# Patient Record
Sex: Female | Born: 1970 | Race: White | Hispanic: No | Marital: Married | Smoking: Former smoker
Health system: Southern US, Community
[De-identification: ages and names within clinical notes are randomized; demographics above are authoritative.]

## PROBLEM LIST (undated history)

## (undated) DIAGNOSIS — I493 Ventricular premature depolarization: Secondary | ICD-10-CM

## (undated) DIAGNOSIS — J302 Other seasonal allergic rhinitis: Secondary | ICD-10-CM

## (undated) DIAGNOSIS — R5383 Other fatigue: Secondary | ICD-10-CM

## (undated) DIAGNOSIS — Z9109 Other allergy status, other than to drugs and biological substances: Secondary | ICD-10-CM

## (undated) DIAGNOSIS — I499 Cardiac arrhythmia, unspecified: Secondary | ICD-10-CM

## (undated) DIAGNOSIS — R008 Other abnormalities of heart beat: Secondary | ICD-10-CM

## (undated) DIAGNOSIS — F419 Anxiety disorder, unspecified: Secondary | ICD-10-CM

## (undated) DIAGNOSIS — R896 Abnormal cytological findings in specimens from other organs, systems and tissues: Secondary | ICD-10-CM

## (undated) DIAGNOSIS — R42 Dizziness and giddiness: Secondary | ICD-10-CM

## (undated) DIAGNOSIS — R0602 Shortness of breath: Secondary | ICD-10-CM

## (undated) DIAGNOSIS — R51 Headache: Secondary | ICD-10-CM

## (undated) HISTORY — DX: Headache: R51

## (undated) HISTORY — DX: Dizziness and giddiness: R42

## (undated) HISTORY — DX: Cardiac arrhythmia, unspecified: I49.9

## (undated) HISTORY — PX: WISDOM TOOTH EXTRACTION: SHX21

## (undated) HISTORY — DX: Other seasonal allergic rhinitis: J30.2

## (undated) HISTORY — DX: Shortness of breath: R06.02

## (undated) HISTORY — DX: Other fatigue: R53.83

## (undated) HISTORY — DX: Ventricular premature depolarization: I49.3

## (undated) HISTORY — DX: Anxiety disorder, unspecified: F41.9

## (undated) HISTORY — DX: Other allergy status, other than to drugs and biological substances: Z91.09

## (undated) HISTORY — PX: TUBAL LIGATION: SHX77

## (undated) HISTORY — DX: Abnormal cytological findings in specimens from other organs, systems and tissues: R89.6

## (undated) HISTORY — DX: Other abnormalities of heart beat: R00.8

---

## 1998-10-15 HISTORY — PX: TUBAL LIGATION: SHX77

## 1999-05-01 ENCOUNTER — Other Ambulatory Visit: Admission: RE | Admit: 1999-05-01 | Discharge: 1999-05-01 | Payer: Self-pay | Admitting: Gynecology

## 1999-10-05 ENCOUNTER — Inpatient Hospital Stay (HOSPITAL_COMMUNITY): Admission: AD | Admit: 1999-10-05 | Discharge: 1999-10-07 | Payer: Self-pay | Admitting: Gynecology

## 1999-10-09 ENCOUNTER — Inpatient Hospital Stay (HOSPITAL_COMMUNITY): Admission: AD | Admit: 1999-10-09 | Discharge: 1999-10-09 | Payer: Self-pay | Admitting: Gynecology

## 1999-11-14 ENCOUNTER — Other Ambulatory Visit: Admission: RE | Admit: 1999-11-14 | Discharge: 1999-11-14 | Payer: Self-pay | Admitting: Gynecology

## 2005-12-10 ENCOUNTER — Other Ambulatory Visit: Admission: RE | Admit: 2005-12-10 | Discharge: 2005-12-10 | Payer: Self-pay | Admitting: Gynecology

## 2007-06-16 DIAGNOSIS — IMO0001 Reserved for inherently not codable concepts without codable children: Secondary | ICD-10-CM

## 2007-06-16 HISTORY — DX: Reserved for inherently not codable concepts without codable children: IMO0001

## 2007-07-09 ENCOUNTER — Other Ambulatory Visit: Admission: RE | Admit: 2007-07-09 | Discharge: 2007-07-09 | Payer: Self-pay | Admitting: Gynecology

## 2008-09-22 ENCOUNTER — Other Ambulatory Visit: Admission: RE | Admit: 2008-09-22 | Discharge: 2008-09-22 | Payer: Self-pay | Admitting: Gynecology

## 2008-09-22 ENCOUNTER — Encounter: Payer: Self-pay | Admitting: Gynecology

## 2008-09-22 ENCOUNTER — Ambulatory Visit: Payer: Self-pay | Admitting: Gynecology

## 2009-06-15 DIAGNOSIS — R008 Other abnormalities of heart beat: Secondary | ICD-10-CM

## 2009-06-15 DIAGNOSIS — I498 Other specified cardiac arrhythmias: Secondary | ICD-10-CM | POA: Insufficient documentation

## 2009-06-15 HISTORY — DX: Other specified cardiac arrhythmias: I49.8

## 2009-06-15 HISTORY — DX: Other abnormalities of heart beat: R00.8

## 2009-07-13 ENCOUNTER — Emergency Department (HOSPITAL_COMMUNITY): Admission: EM | Admit: 2009-07-13 | Discharge: 2009-07-13 | Payer: Self-pay | Admitting: Emergency Medicine

## 2009-07-13 DIAGNOSIS — R0602 Shortness of breath: Secondary | ICD-10-CM | POA: Insufficient documentation

## 2009-07-13 DIAGNOSIS — R42 Dizziness and giddiness: Secondary | ICD-10-CM | POA: Insufficient documentation

## 2009-07-13 HISTORY — DX: Shortness of breath: R06.02

## 2009-07-13 HISTORY — DX: Dizziness and giddiness: R42

## 2009-07-14 ENCOUNTER — Emergency Department (HOSPITAL_COMMUNITY): Admission: EM | Admit: 2009-07-14 | Discharge: 2009-07-14 | Payer: Self-pay | Admitting: Emergency Medicine

## 2009-07-14 DIAGNOSIS — R519 Headache, unspecified: Secondary | ICD-10-CM | POA: Insufficient documentation

## 2009-07-14 HISTORY — DX: Headache, unspecified: R51.9

## 2009-07-18 ENCOUNTER — Ambulatory Visit: Payer: Self-pay | Admitting: Family Medicine

## 2009-07-22 ENCOUNTER — Ambulatory Visit: Payer: Self-pay | Admitting: Cardiovascular Disease

## 2009-07-22 ENCOUNTER — Encounter: Payer: Self-pay | Admitting: Cardiovascular Disease

## 2009-07-25 ENCOUNTER — Ambulatory Visit: Payer: Self-pay | Admitting: Family Medicine

## 2009-07-25 ENCOUNTER — Encounter: Payer: Self-pay | Admitting: Cardiovascular Disease

## 2009-07-27 ENCOUNTER — Encounter: Payer: Self-pay | Admitting: Cardiovascular Disease

## 2009-07-27 ENCOUNTER — Ambulatory Visit: Payer: Self-pay | Admitting: Cardiovascular Disease

## 2009-08-04 ENCOUNTER — Encounter: Payer: Self-pay | Admitting: Cardiovascular Disease

## 2009-08-04 ENCOUNTER — Ambulatory Visit: Payer: Self-pay | Admitting: Family Medicine

## 2009-08-22 ENCOUNTER — Telehealth: Payer: Self-pay | Admitting: Cardiovascular Disease

## 2009-08-22 ENCOUNTER — Ambulatory Visit: Payer: Self-pay | Admitting: Cardiovascular Disease

## 2009-08-24 ENCOUNTER — Ambulatory Visit: Payer: Self-pay | Admitting: Family Medicine

## 2009-09-05 ENCOUNTER — Ambulatory Visit: Payer: Self-pay | Admitting: Family Medicine

## 2009-09-23 ENCOUNTER — Other Ambulatory Visit: Admission: RE | Admit: 2009-09-23 | Discharge: 2009-09-23 | Payer: Self-pay | Admitting: Gynecology

## 2009-09-23 ENCOUNTER — Ambulatory Visit: Payer: Self-pay | Admitting: Gynecology

## 2009-11-01 ENCOUNTER — Ambulatory Visit: Payer: Self-pay | Admitting: Family Medicine

## 2009-11-22 ENCOUNTER — Ambulatory Visit: Payer: Self-pay | Admitting: Cardiovascular Disease

## 2009-11-25 ENCOUNTER — Encounter (INDEPENDENT_AMBULATORY_CARE_PROVIDER_SITE_OTHER): Payer: Self-pay | Admitting: *Deleted

## 2009-12-27 ENCOUNTER — Telehealth: Payer: Self-pay | Admitting: Cardiovascular Disease

## 2009-12-29 ENCOUNTER — Ambulatory Visit: Payer: Self-pay | Admitting: Cardiology

## 2009-12-29 ENCOUNTER — Ambulatory Visit (HOSPITAL_COMMUNITY): Admission: RE | Admit: 2009-12-29 | Discharge: 2009-12-29 | Payer: Self-pay | Admitting: Cardiovascular Disease

## 2010-05-19 ENCOUNTER — Telehealth (INDEPENDENT_AMBULATORY_CARE_PROVIDER_SITE_OTHER): Payer: Self-pay | Admitting: *Deleted

## 2010-11-14 NOTE — Letter (Signed)
Summary: Appointment - Cardiac MRI  [facility] Card[facility]     [place], [place]    Phone:   Fax:       November 25, 2009 MRN: [id number]   [facility] [address] [place], [place]  [address]   Dear Ms. [name],   We have scheduled the above patient for an appointment for a Cardiac MRI on 12-29-2009   at 9:00a.m.  Please refer to the below information for the location and instructions for this test:  Location:     [facility]       [address]       [place], [place]  [address] Instructions:    Wilmon Arms at [facility] Outpatient Registration 45 minutes prior to your appointment time.  This will ensure you are in the Radiology Department 30 minutes prior to your appointment.    There are no restrictions for this test you may eat and take medications as usual.  If you need to reschedule this appointment please call at the number listed above.    Sincerely,       Lorne Skeens  [facility] Scheduling Team

## 2010-11-14 NOTE — Progress Notes (Signed)
----   Converted from flag ---- ---- 12/27/2009 12:00 PM, Marilynne Halsted, CMA, AAMA wrote: Lynford Humphrey!  ---- 12/27/2009 11:41 AM, Dessie Coma wrote: Dr. Freida Busman did the MD review and the authorization # [id number] ------------------------------

## 2010-11-14 NOTE — Progress Notes (Signed)
  [name], she completed faxed back to me, I copied records [place] out today. Savannah Dennis  May 19, 2010 8:34 AM     Appended Document:  Copied Records again Mailed to pt

## 2010-11-27 ENCOUNTER — Ambulatory Visit: Payer: Self-pay | Admitting: Cardiovascular Disease

## 2010-12-27 ENCOUNTER — Encounter: Payer: Self-pay | Admitting: Cardiovascular Disease

## 2011-01-03 ENCOUNTER — Encounter: Payer: Self-pay | Admitting: Cardiology

## 2011-01-08 LAB — CREATININE, SERUM
Creatinine, Ser: 0.68 mg/dL (ref 0.4–1.2)
GFR calc Af Amer: >60 mL/min (ref >60)
GFR calc non Af Amer: >60 mL/min (ref >60)

## 2011-01-08 LAB — BUN: BUN: 11 mg/dL (ref 6–23)

## 2011-01-10 ENCOUNTER — Other Ambulatory Visit: Payer: Self-pay | Admitting: *Deleted

## 2011-01-10 DIAGNOSIS — I493 Ventricular premature depolarization: Secondary | ICD-10-CM

## 2011-01-11 ENCOUNTER — Other Ambulatory Visit: Payer: Self-pay | Admitting: *Deleted

## 2011-01-11 MED ORDER — ATENOLOL 25 MG PO TABS: 25.0000 mg | ORAL_TABLET | Freq: Every day | ORAL | Status: DC

## 2011-01-12 ENCOUNTER — Encounter (INDEPENDENT_AMBULATORY_CARE_PROVIDER_SITE_OTHER): Payer: 59 | Admitting: Gynecology

## 2011-01-12 ENCOUNTER — Telehealth: Payer: Self-pay

## 2011-01-12 ENCOUNTER — Ambulatory Visit (INDEPENDENT_AMBULATORY_CARE_PROVIDER_SITE_OTHER): Payer: 59 | Admitting: Cardiology

## 2011-01-12 ENCOUNTER — Encounter: Payer: Self-pay | Admitting: Cardiology

## 2011-01-12 VITALS — BP 111/77 | HR 79 | Resp 18 | Ht 66.0 in | Wt 166.4 lb

## 2011-01-12 DIAGNOSIS — I493 Ventricular premature depolarization: Secondary | ICD-10-CM

## 2011-01-12 DIAGNOSIS — N898 Other specified noninflammatory disorders of vagina: Secondary | ICD-10-CM

## 2011-01-12 DIAGNOSIS — B373 Candidiasis of vulva and vagina: Secondary | ICD-10-CM

## 2011-01-12 DIAGNOSIS — B3731 Acute candidiasis of vulva and vagina: Secondary | ICD-10-CM

## 2011-01-12 DIAGNOSIS — N951 Menopausal and female climacteric states: Secondary | ICD-10-CM

## 2011-01-12 DIAGNOSIS — Z01419 Encounter for gynecological examination (general) (routine) without abnormal findings: Secondary | ICD-10-CM

## 2011-01-12 DIAGNOSIS — I4949 Other premature depolarization: Secondary | ICD-10-CM

## 2011-01-12 HISTORY — DX: Ventricular premature depolarization: I49.3
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
## 2011-01-12 NOTE — Patient Instructions (Addendum)
Schedule an appointment for a 48 hour monitor.  Please ask Dr Audie Box to fax your recent TSH to Dr Shirlee Latch [phone].  Schedule an appointment to see Dr Shirlee Latch in 1 year(March 2013)

## 2011-01-12 NOTE — Assessment & Plan Note (Signed)
Patient has been evaluated in the past by Dr. Freida Busman at our [facility] office and was found to have a high burden of PVCs (20% total beats) with a LBBB morphology. She was started on atenolol with significant improvement in palpitations.  She had a structurally normal heart as evidenced by normal stress echo and cardiac MRI with no evidence for arrhythmogenic RV cardiomyopathy.  No major abnormalities on ECG.  My main concern at this point is that she continues to have a high burden of PVCs as this can potentially lead to a cardiomyopathy.  She will continue atenolol.  I will get a repeat 48 hour holter monitor.  If PVC burden is still 20% or greater, will repeat echo to reassess LV function.  She had BMET and TSH done recently by her gynecologist, which we will try to obtain.  Finally, she should try to cut back further on caffeine intake.

## 2011-01-18 ENCOUNTER — Encounter (INDEPENDENT_AMBULATORY_CARE_PROVIDER_SITE_OTHER): Payer: 59

## 2011-01-18 DIAGNOSIS — R002 Palpitations: Secondary | ICD-10-CM

## 2011-01-18 NOTE — Telephone Encounter (Signed)
Patient has appt. For monitor on 01/18/2011

## 2011-01-19 LAB — BASIC METABOLIC PANEL
Creatinine, Ser: 0.76 mg/dL (ref 0.4–1.2)
Glucose, Bld: 93 mg/dL (ref 70–99)
Potassium: 4.8 mEq/L (ref 3.5–5.1)
Sodium: 139 mEq/L (ref 135–145)

## 2011-01-19 LAB — DIFFERENTIAL
Basophils Absolute: 0 10*3/uL (ref 0.0–0.1)
Basophils Relative: 1 % (ref 0–1)
Lymphocytes Relative: 16 % (ref 12–46)
Lymphs Abs: 1.6 10*3/uL (ref 0.7–4.0)
Monocytes Absolute: 0.4 10*3/uL (ref 0.1–1.0)

## 2011-01-19 LAB — CBC
HCT: 38.9 % (ref 36.0–46.0)
MCV: 91.8 fL (ref 78.0–100.0)
Platelets: 179 10*3/uL (ref 150–400)

## 2011-02-06 ENCOUNTER — Telehealth: Payer: Self-pay | Admitting: *Deleted

## 2011-02-06 NOTE — Telephone Encounter (Signed)
01/29/11 Dr Shirlee Latch reviewed monitor done 01/18/11--frequent PVCs but only 1% total beats so unlikely to be risk for cardiomyopathy. Pt given results by telephone.
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
## 2011-03-02 NOTE — Op Note (Signed)
[facility]  Patient:    Savannah Dennis                       MRN: [id number] Proc. Date: 10/05/99 Adm. Date:  [id number] Attending:  Merrily Pew                           Operative Report  PREOPERATIVE DIAGNOSIS:       Pregnancy at term.  History of prior cesarean section, desires repeat cesarean section.  Multiparous, desires permanent sterilization.  POSTOPERATIVE DIAGNOSIS:      Pregnancy at term.  History of prior cesarean section, desires repeat cesarean section.  Multiparous, desires permanent sterilization.  OPERATION:                    Repeat low transverse cesarean section and bilateral tubal sterilization.  SURGEON:                      Timothy P. Fontaine, M.D.  ASSISTANT:  ANESTHESIA:                   Regional.  COMPLICATIONS:                None.  ESTIMATED BLOOD LOSS:         Less than 500 cc.  SPECIMEN:                     Portions of right and left fallopian tubes. Samples of cord blood.  FINDINGS:                     At 12:55 a normal female infant, Apgars 8 and 9, weight 10 pounds 5 ounces, pelvic anatomy noted to be normal.  DESCRIPTION OF PROCEDURE:     The patient was taken to the operating room and underwent regional anesthesia and was placed in the left tilt supine position. Received an abdominal preparation with Betadine scrub and Betadine solution and a Foley catheter was placed in a sterile technique.  The patient was draped in the usual fashion and after assuring adequate anesthesia, the abdomen was sharply entered through a repeat Pfannenstiel incision achieving adequate hemostasis at all levels.  The bladder flap was then sharply and bluntly developed without difficulty, the uterus sharply entered in the lower uterine segment, and bluntly extended laterally.  The bulging membranes ruptured, the fluid noted to be clear. The infants head delivered through the incision with the assistance of  the vacuum extractor.  A nuchal cord x 1 was reduced.  The nares and mouth suctioned, the est of the infant delivered, the cord doubly clamped and cut, and the infant handed to pediatrics in attendance.  Samples of cord blood were obtained.  The placenta was then spontaneously extruded and noted to be intact.  The uterus was exteriorized and the endometrial cavity was explored with a sponge to remove all placental and membrane fragments.  The uterine incision was then closed in one layer using 0 Vicryl suture in a running interlocking stitch.  The right fallopian tubes was hen identified, traced from its insertion to its fimbriated end and a midtubal segment was doubly ligated using 0 plain suture and sharply excised.  Hemostasis as well as gross tubal lumen was identified.  A similar procedure was carried out on the other side.  The uterus was then returned to  the abdomen which was copiously irrigated. Adequate hemostasis was visualized.  Both right and left tubal sites were reinspected to ensure that they were intact and no bleeding and the anterior fascia was then reapproximated using 0 Vicryl suture in a running stitch starting at the angle and meeting in the middle.  The subcutaneous tissues were irrigated. Adequate hemostasis was visualized and the skin was reapproximated with staples.  A sterile dressing was applied.  The patient was taken to the recovery room in good condition having tolerated the procedure well. DD:  10/05/99 TD:  10/06/99 Job: [id number]

## 2012-03-21 ENCOUNTER — Encounter: Payer: 59 | Admitting: Gynecology

## 2012-03-27 ENCOUNTER — Encounter: Payer: Self-pay | Admitting: Gynecology

## 2012-03-27 DIAGNOSIS — I493 Ventricular premature depolarization: Secondary | ICD-10-CM | POA: Insufficient documentation

## 2012-04-04 ENCOUNTER — Ambulatory Visit (INDEPENDENT_AMBULATORY_CARE_PROVIDER_SITE_OTHER): Payer: 59 | Admitting: Gynecology

## 2012-04-04 ENCOUNTER — Ambulatory Visit (INDEPENDENT_AMBULATORY_CARE_PROVIDER_SITE_OTHER): Payer: 59 | Admitting: Cardiology

## 2012-04-04 ENCOUNTER — Other Ambulatory Visit (HOSPITAL_COMMUNITY)
Admission: RE | Admit: 2012-04-04 | Discharge: 2012-04-04 | Disposition: A | Discharge status: Home / Self Care | Payer: 59 | Source: Ambulatory Visit | Attending: Gynecology | Admitting: Gynecology

## 2012-04-04 ENCOUNTER — Encounter: Payer: Self-pay | Admitting: Gynecology

## 2012-04-04 ENCOUNTER — Encounter: Payer: Self-pay | Admitting: Cardiology

## 2012-04-04 VITALS — BP 124/70 | Ht 66.0 in | Wt 182.0 lb

## 2012-04-04 VITALS — BP 114/76 | HR 75 | Ht 66.0 in | Wt 180.0 lb

## 2012-04-04 DIAGNOSIS — N920 Excessive and frequent menstruation with regular cycle: Secondary | ICD-10-CM

## 2012-04-04 DIAGNOSIS — Z01419 Encounter for gynecological examination (general) (routine) without abnormal findings: Secondary | ICD-10-CM | POA: Insufficient documentation

## 2012-04-04 DIAGNOSIS — Z1159 Encounter for screening for other viral diseases: Secondary | ICD-10-CM | POA: Insufficient documentation

## 2012-04-04 DIAGNOSIS — I4949 Other premature depolarization: Secondary | ICD-10-CM

## 2012-04-04 DIAGNOSIS — I493 Ventricular premature depolarization: Secondary | ICD-10-CM

## 2012-04-04 MED ORDER — ATENOLOL 25 MG PO TABS: 25.0000 mg | ORAL_TABLET | Freq: Every day | ORAL | Status: DC

## 2012-04-04 NOTE — Patient Instructions (Signed)
Follow up for sonohysterogram as scheduled. 

## 2012-04-04 NOTE — Addendum Note (Signed)
Addended by: Richardson Chiquito on: 04/04/2012 03:28 PM   Modules accepted: Orders

## 2012-04-04 NOTE — Patient Instructions (Addendum)
Your physician wants you to follow-up in: 1 year with Dr McLean. (June 2014).  You will receive a reminder letter in the mail two months in advance. If you don't receive a letter, please call our office to schedule the follow-up appointment.  

## 2012-04-04 NOTE — Progress Notes (Signed)
[place] 1971/02/02 [id number]        41 y.o.  for annual exam.  Several issues noted below.  Past medical history,surgical history, medications, allergies, family history and social history were all reviewed and documented in the EPIC chart. ROS:  Was performed and pertinent positives and negatives are included in the history.  Exam: [name] assistant present Filed Vitals:   04/04/12 1459  BP: 124/70   General appearance  Normal Skin grossly normal Head/Neck normal with no cervical or supraclavicular adenopathy thyroid normal Lungs  clear Cardiac RR, without RMG Abdominal  soft, nontender, without masses, organomegaly or hernia Breasts  examined lying and sitting without masses, retractions, discharge or axillary adenopathy. Pelvic  Ext/BUS/vagina  normal   Cervix  normal with Pap/HPV  Uterus  anteverted, normal size, shape and contour, midline and mobile nontender   Adnexa  Without masses or tenderness    Anus and perineum  normal   Rectovaginal  normal sphincter tone without palpated masses or tenderness.    Assessment/Plan:  41 y.o. female for annual exam.    1. Menorrhagia. Patient has a long history of menorrhagia. She wears double protection frequent pad changes. Her menses otherwise her regular period and check baseline CBC TSH sonohysterogram. She did have a set hysterogram back in 2008 for similar complaints which was normal. When rechecked now. Various options and scenarios were reviewed. She is status post BTL I think she would be a good candidate for ablation. Alternatives to include hormonal manipulation, Mirena IUD up to and including hysterectomy reviewed. 2. Mammography. Patient had her mammogram today. SBE monthly reviewed. 3. Pap smear. Pap/HPV done today. She had ascus negative high risk HPV in 2008 with normal follow up Pap smears.. Assuming this is normal then we'll plan every 5 your Pap smears per current screening guidelines. 4. Health maintenance. Patient  reports her primary physician does all of her routine blood work to include cholesterol glucose and she will follow up with them for this.    Dara Lords MD, 3:18 PM 04/04/2012

## 2012-04-05 LAB — URINALYSIS W MICROSCOPIC + REFLEX CULTURE
Bilirubin Urine: NEGATIVE
Crystals: NONE SEEN
Glucose, UA: NEGATIVE mg/dL
Ketones, ur: NEGATIVE mg/dL
Protein, ur: NEGATIVE mg/dL
Specific Gravity, Urine: 1.016 (ref 1.005–1.030)

## 2012-04-06 NOTE — Assessment & Plan Note (Signed)
Patient was seen by Dr. Freida Busman in [place] and was found to have a high burden of PVCs (20% total beats) with a LBBB morphology in 10/10. She was started on atenolol with significant improvement in palpitations.  She had a structurally normal heart as evidenced by normal stress echo and cardiac MRI with no evidence for arrhythmogenic RV cardiomyopathy.  No major abnormalities on ECG.  My main concern was that if she continued to have a high burden of PVCs it could potentially lead to a cardiomyopathy.  Repeat holter on atenolol showed only 1% PVCs in 4/12.  She has had no significant palpitations since I last saw her.  Will plan to have her continue atenolol and followup in 1 year.
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
## 2012-04-07 ENCOUNTER — Other Ambulatory Visit: Payer: Self-pay | Admitting: *Deleted

## 2012-04-07 DIAGNOSIS — I4949 Other premature depolarization: Secondary | ICD-10-CM

## 2012-04-07 MED ORDER — ATENOLOL 25 MG PO TABS: 25.0000 mg | ORAL_TABLET | Freq: Every day | ORAL | Status: DC

## 2012-04-10 ENCOUNTER — Encounter: Payer: Self-pay | Admitting: Gynecology

## 2012-04-14 ENCOUNTER — Other Ambulatory Visit: Payer: Self-pay | Admitting: Gynecology

## 2012-04-14 DIAGNOSIS — N920 Excessive and frequent menstruation with regular cycle: Secondary | ICD-10-CM

## 2012-04-24 ENCOUNTER — Other Ambulatory Visit: Payer: 59

## 2012-04-24 ENCOUNTER — Ambulatory Visit: Payer: 59 | Admitting: Gynecology

## 2012-05-27 ENCOUNTER — Other Ambulatory Visit: Payer: Self-pay | Admitting: *Deleted

## 2012-05-27 DIAGNOSIS — I4949 Other premature depolarization: Secondary | ICD-10-CM

## 2012-05-27 MED ORDER — ATENOLOL 25 MG PO TABS: 25.0000 mg | ORAL_TABLET | Freq: Every day | ORAL | Status: DC

## 2012-10-23 ENCOUNTER — Encounter: Payer: Self-pay | Admitting: Physician Assistant

## 2013-04-06 ENCOUNTER — Other Ambulatory Visit: Payer: Self-pay | Admitting: Cardiology

## 2013-07-29 ENCOUNTER — Ambulatory Visit: Payer: 59 | Admitting: Physician Assistant

## 2013-07-29 ENCOUNTER — Other Ambulatory Visit: Payer: Self-pay

## 2013-07-29 MED ORDER — ATENOLOL 25 MG PO TABS: ORAL_TABLET | ORAL | Status: DC

## 2013-08-12 ENCOUNTER — Other Ambulatory Visit: Payer: Self-pay

## 2013-08-12 ENCOUNTER — Other Ambulatory Visit: Payer: Self-pay | Admitting: *Deleted

## 2013-08-12 ENCOUNTER — Encounter: Payer: Self-pay | Admitting: Physician Assistant

## 2013-08-12 ENCOUNTER — Ambulatory Visit (INDEPENDENT_AMBULATORY_CARE_PROVIDER_SITE_OTHER): Payer: 59 | Admitting: Physician Assistant

## 2013-08-12 VITALS — BP 132/81 | HR 55 | Ht 66.0 in | Wt 187.0 lb

## 2013-08-12 DIAGNOSIS — Z720 Tobacco use: Secondary | ICD-10-CM

## 2013-08-12 DIAGNOSIS — I493 Ventricular premature depolarization: Secondary | ICD-10-CM

## 2013-08-12 DIAGNOSIS — F17200 Nicotine dependence, unspecified, uncomplicated: Secondary | ICD-10-CM

## 2013-08-12 DIAGNOSIS — R0789 Other chest pain: Secondary | ICD-10-CM

## 2013-08-12 DIAGNOSIS — R079 Chest pain, unspecified: Secondary | ICD-10-CM

## 2013-08-12 DIAGNOSIS — I4949 Other premature depolarization: Secondary | ICD-10-CM

## 2013-08-12 HISTORY — DX: Other chest pain: R07.89

## 2013-08-12 HISTORY — DX: Tobacco use: Z72.0

## 2013-08-12 MED ORDER — ATENOLOL 25 MG PO TABS: ORAL_TABLET | ORAL | Status: DC

## 2013-08-12 NOTE — Assessment & Plan Note (Signed)
Patient has history of chest pain associated with her palpitations for the past 3 months. She does have a family history of coronary artery disease with a grandmother having multiple MIs starting around age 44 and died of heart failure at age 54. She also has a history of smoking but quit a year ago. We will order a stress echo as she had one done in 2010 we can compare it to. She will follow up with Dr. Jearld Pies in one month.

## 2013-08-12 NOTE — Assessment & Plan Note (Signed)
Patient has a long history of PVCs. She has done well on atenolol until the past 3 months where she's had increase in palpitations associated with chest pain, dizziness, shortness of breath and diaphoresis. I will order an of event recorder and stress echo. We will obtain her labs from [facility] to see if a TSH was done.

## 2013-08-12 NOTE — Assessment & Plan Note (Signed)
Patient quit smoking one year ago in August

## 2013-08-12 NOTE — Patient Instructions (Signed)
Your physician has requested that you have an echocardiogram BEFORE APPOINTMENT WITH DR. Shirlee Latch. Echocardiography is a painless test that uses sound waves to create images of your heart. It provides your doctor with information about the size and shape of your heart and how well your heart's chambers and valves are working. This procedure takes approximately one hour. There are no restrictions for this procedure.  Your physician has recommended that you wear an event monitor. Event monitors are medical devices that record the heart's electrical activity. Doctors most often [place] these monitors to diagnose arrhythmias. Arrhythmias are problems with the speed or rhythm of the heartbeat. The monitor is a small, portable device. You can wear one while you do your normal daily activities. This is usually used to diagnose what is causing palpitations/syncope (passing out).  Your physician recommends that you schedule a follow-up appointment in: 1 months with DR. MCLEAN  PLEASE FAX LABS RESULTS TO OUR OFFICE AT [phone]
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
## 2013-08-13 ENCOUNTER — Telehealth: Payer: Self-pay | Admitting: *Deleted

## 2013-08-13 ENCOUNTER — Telehealth: Payer: Self-pay | Admitting: Cardiology

## 2013-08-13 NOTE — Telephone Encounter (Signed)
New problem:  Pt states she is calling in to hear her blood results. Thanks

## 2013-08-13 NOTE — Telephone Encounter (Signed)
Lm for pt to call back in regards to her labs. Number provided

## 2013-08-13 NOTE — Telephone Encounter (Signed)
Pt states she is returning a call to [place], will forward

## 2013-08-17 NOTE — Telephone Encounter (Signed)
Called pt back to discuss her tsh and bmet results she faxed to office. Per Jacolyn Reedy due to her lab results she do not have to repeat those labs.

## 2013-08-24 ENCOUNTER — Encounter: Payer: Self-pay | Admitting: *Deleted

## 2013-08-24 ENCOUNTER — Encounter (INDEPENDENT_AMBULATORY_CARE_PROVIDER_SITE_OTHER): Payer: 59

## 2013-08-24 DIAGNOSIS — R002 Palpitations: Secondary | ICD-10-CM

## 2013-08-24 DIAGNOSIS — R079 Chest pain, unspecified: Secondary | ICD-10-CM

## 2013-08-24 DIAGNOSIS — R42 Dizziness and giddiness: Secondary | ICD-10-CM

## 2013-08-24 DIAGNOSIS — I4949 Other premature depolarization: Secondary | ICD-10-CM

## 2013-08-24 DIAGNOSIS — I493 Ventricular premature depolarization: Secondary | ICD-10-CM

## 2013-08-24 NOTE — Progress Notes (Signed)
Patient ID: [place], female   DOB: December 04, 1970, 42 y.o.   MRN: [id number] [employer] 30 day cardiac event monitor applied to patient.

## 2013-09-02 ENCOUNTER — Ambulatory Visit (HOSPITAL_COMMUNITY): Payer: 59 | Attending: Cardiovascular Disease | Admitting: Radiology

## 2013-09-02 ENCOUNTER — Ambulatory Visit (HOSPITAL_BASED_OUTPATIENT_CLINIC_OR_DEPARTMENT_OTHER): Payer: 59

## 2013-09-02 ENCOUNTER — Encounter: Payer: Self-pay | Admitting: Cardiovascular Disease

## 2013-09-02 DIAGNOSIS — R002 Palpitations: Secondary | ICD-10-CM | POA: Insufficient documentation

## 2013-09-02 DIAGNOSIS — R0989 Other specified symptoms and signs involving the circulatory and respiratory systems: Secondary | ICD-10-CM | POA: Insufficient documentation

## 2013-09-02 DIAGNOSIS — R079 Chest pain, unspecified: Secondary | ICD-10-CM

## 2013-09-02 DIAGNOSIS — R5381 Other malaise: Secondary | ICD-10-CM | POA: Insufficient documentation

## 2013-09-02 DIAGNOSIS — Z87891 Personal history of nicotine dependence: Secondary | ICD-10-CM | POA: Insufficient documentation

## 2013-09-02 DIAGNOSIS — Z8249 Family history of ischemic heart disease and other diseases of the circulatory system: Secondary | ICD-10-CM | POA: Insufficient documentation

## 2013-09-02 DIAGNOSIS — R072 Precordial pain: Secondary | ICD-10-CM

## 2013-09-02 DIAGNOSIS — R0609 Other forms of dyspnea: Secondary | ICD-10-CM | POA: Insufficient documentation

## 2013-09-02 NOTE — Progress Notes (Signed)
Stres Echocardiogram performed.

## 2013-09-03 ENCOUNTER — Encounter (HOSPITAL_COMMUNITY): Payer: Self-pay | Admitting: Interventional Cardiology

## 2013-10-01 ENCOUNTER — Telehealth: Payer: Self-pay | Admitting: *Deleted

## 2013-10-01 MED ORDER — ATENOLOL 25 MG PO TABS: ORAL_TABLET | ORAL | Status: DC

## 2013-10-01 NOTE — Telephone Encounter (Signed)
10/01/13  Dr Shirlee Latch reviewed monitor done 08/24/13  Frequent PVCs She could increase atenolol to 37.5mg  daily is still having frequent palpitations.  Spoke with patient, symptoms are better but not completely resolved. She will increase atenolol to 37.5mg  daily and discuss further with Dr Shirlee Latch at time of appt 10/13/13

## 2013-10-13 ENCOUNTER — Ambulatory Visit: Payer: 59 | Admitting: Cardiology

## 2013-10-22 ENCOUNTER — Ambulatory Visit (INDEPENDENT_AMBULATORY_CARE_PROVIDER_SITE_OTHER): Payer: 59 | Admitting: Cardiology

## 2013-10-22 VITALS — BP 117/74 | HR 59 | Ht 66.0 in | Wt 189.0 lb

## 2013-10-22 DIAGNOSIS — I4949 Other premature depolarization: Secondary | ICD-10-CM

## 2013-10-22 DIAGNOSIS — R079 Chest pain, unspecified: Secondary | ICD-10-CM

## 2013-10-22 DIAGNOSIS — I493 Ventricular premature depolarization: Secondary | ICD-10-CM

## 2013-10-22 NOTE — Patient Instructions (Signed)
Your physician wants you to follow-up in: 1 year with Dr McLean. (January 2016).  You will receive a reminder letter in the mail two months in advance. If you don't receive a letter, please call our office to schedule the follow-up appointment.  

## 2013-10-24 ENCOUNTER — Encounter: Payer: Self-pay | Admitting: Cardiology

## 2013-10-24 NOTE — Progress Notes (Signed)
Patient ID: [place], female   DOB: 1971/04/21, 43 y.o.   MRN: [id number] PCP: Dr. Ashby Dawes  43 yo with history of frequent PVCs presents for cardiology followup.   She has had known PVCs since her 43s.  Holter monitor done in 2010 showed 20% PVC burden.  She had a normal stress echo in 11/14.  She had a cardiac MRI in 2011 with no evidence for arrhythmic RV cardiomyopathy.  Not long ago, she started having more palpitations (in 10/14 when she was under a lot of stress at work) and I had her increase atenolol to 37.5 mg daily.  This has helped her symptoms considerably and she is really not noting palpitations now.  She does have some atypical needle-like chest pain that tends to be momentary.  No exertional chest pain or dyspnea.    PMH: 1. PVCs: Patient has been known to have PVCs since her 69s.  She was on metoprolol at one point, then stopped it.  She developed recurrent palpitations and was put on atenolol with improvement.  Holter (10/10) prior to atenolol: 20% PVCs with LBBB morphology.  Cardiac MRI (3/11): Study limited by artifact (gating problems from frequent PVCs), EF estimated to be 55-60%, no evidence of ARVC, no definite delayed enhancement.  Holter (4/12) with 1% total PVCs (considerably decreased compared to 10/10).  Event monitor in 11/14 with PVCs.  2. Atypical chest pain: Stress echo (10/10) with 9' exercise, no evidence for ischemia or infarction.  Stress echo (11/14) showed no ischemia or infarction.   SH: Lives in [place] husband.  Has 5 boys.  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  Nonsmoker.   FH: Grandmother had multiple MIs starting around 73 and died of CHF at 7.  She does not know about her mother's medical history.  Father with CAD but was a smoker and alcoholic.  Son with mild PVCs.     Current Outpatient Prescriptions  Medication Sig Dispense Refill  . atenolol (TENORMIN) 25 MG tablet 1 and 1/2 tablets (total 37.5mg ) daily  135 tablet  3   No current  facility-administered medications for this visit.    BP 117/74  Pulse 59  Ht 5\' 6"  (1.676 m)  Wt 85.73 kg (189 lb)  BMI 30.52 kg/m2 General: NAD Neck: No JVD, no thyromegaly or thyroid nodule.  Lungs: Clear to auscultation bilaterally with normal respiratory effort. CV: Nondisplaced PMI.  Heart regular S1/S2, no S3/S4, no murmur.  No peripheral edema.  No carotid bruit.  Normal pedal pulses.  Abdomen: Soft, nontender, no hepatosplenomegaly, no distention.  Neurologic: Alert and oriented x 3.  Psych: Normal affect. Extremities: No clubbing or cyanosis.   Assessment/Plan: 1. Chest pain: Atypical.  Negative stress echo in 11/14.  2. PVCs: Improved with increase in atenolol.  No longer symptomatic.    [place] 10/24/2013

## 2014-04-01 ENCOUNTER — Other Ambulatory Visit: Payer: Self-pay | Admitting: Cardiology

## 2014-08-16 ENCOUNTER — Encounter: Payer: Self-pay | Admitting: Cardiology

## 2014-10-01 ENCOUNTER — Other Ambulatory Visit: Payer: Self-pay | Admitting: Cardiology

## 2014-10-15 HISTORY — PX: HIP ARTHROSCOPY: SUR88

## 2015-01-10 ENCOUNTER — Other Ambulatory Visit: Payer: Self-pay | Admitting: Cardiology

## 2015-01-10 ENCOUNTER — Telehealth: Payer: Self-pay

## 2015-01-10 NOTE — Telephone Encounter (Signed)
I called pt to let her know that she needs to schedule a follow up office visit to receive further refills (pt requested refill of Atenolol) pt stated she is out of town and knows she needs to schedule follow up OV but will not be doing so until back in town next week. She also stated she has not run out of her medication (Atenolol) because she has just been taking one tablet daily. Pt just insisted she was "fine and not too worried about it" and would make appointment when she go back into town. I explained that I would send a message to Dr. Claris Gladden nurse in case Dr. Aundra Dubin wanted something called in now.

## 2015-01-10 NOTE — Telephone Encounter (Signed)
See telephone note from today.

## 2015-07-29 ENCOUNTER — Ambulatory Visit: Payer: Self-pay | Admitting: Gynecology

## 2015-08-17 DIAGNOSIS — M25552 Pain in left hip: Secondary | ICD-10-CM

## 2015-08-17 HISTORY — DX: Pain in left hip: M25.552
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
## 2015-09-21 ENCOUNTER — Encounter: Payer: Self-pay | Admitting: Physician Assistant

## 2015-09-21 ENCOUNTER — Ambulatory Visit (INDEPENDENT_AMBULATORY_CARE_PROVIDER_SITE_OTHER): Payer: 59 | Admitting: Physician Assistant

## 2015-09-21 VITALS — BP 116/62 | HR 51 | Ht 66.0 in | Wt 164.0 lb

## 2015-09-21 DIAGNOSIS — I493 Ventricular premature depolarization: Secondary | ICD-10-CM | POA: Diagnosis not present

## 2015-09-21 MED ORDER — ATENOLOL 25 MG PO TABS: 12.5000 mg | ORAL_TABLET | Freq: Every day | ORAL | Status: DC

## 2015-09-21 NOTE — Patient Instructions (Signed)
Medication Instructions:  REFILL SENT IN FOR ATENOLOL 12.5 MG DAILY  Labwork: NONE  Testing/Procedures: NONE  Follow-Up: 1 YEAR DR. Aundra Dubin  Any Other Special Instructions Will Be Listed Below (If Applicable).   If you need a refill on your cardiac medications before your next appointment, please call your pharmacy.

## 2015-12-07 ENCOUNTER — Other Ambulatory Visit: Payer: Self-pay | Admitting: *Deleted

## 2016-09-26 ENCOUNTER — Ambulatory Visit (INDEPENDENT_AMBULATORY_CARE_PROVIDER_SITE_OTHER): Payer: 59 | Admitting: Physician Assistant

## 2016-09-26 ENCOUNTER — Ambulatory Visit: Payer: 59 | Admitting: Physician Assistant

## 2016-09-26 VITALS — BP 120/70 | HR 64 | Ht 66.0 in | Wt 168.0 lb

## 2016-09-26 DIAGNOSIS — I493 Ventricular premature depolarization: Secondary | ICD-10-CM | POA: Diagnosis not present

## 2016-09-26 DIAGNOSIS — R0789 Other chest pain: Secondary | ICD-10-CM | POA: Diagnosis not present

## 2016-09-26 MED ORDER — ATENOLOL 25 MG PO TABS: 25.0000 mg | ORAL_TABLET | Freq: Every day | ORAL | 3 refills | Status: DC
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
## 2016-09-26 NOTE — Patient Instructions (Signed)
Medication Instructions:   Your physician has recommended you make the following change in your medication:  1) INCREASE Atenolol to 25 mg daily  --- If you need a refill on your cardiac medications before your next appointment, please call your pharmacy. ---  Labwork:  None ordered  Testing/Procedures:  None ordered  Follow-Up:  Your physician wants you to follow-up in: 1 year with Dr. Saunders Revel.  You will receive a reminder letter in the mail two months in advance. If you don't receive a letter, please call our office to schedule the follow-up appointment.  Thank you for choosing [facility]!!

## 2016-09-27 NOTE — Addendum Note (Signed)
Addended by: Marlis Edelson C on: 09/27/2016 10:21 AM   Modules accepted: Orders

## 2016-11-27 ENCOUNTER — Other Ambulatory Visit: Payer: Self-pay | Admitting: Physician Assistant

## 2017-07-04 ENCOUNTER — Ambulatory Visit (INDEPENDENT_AMBULATORY_CARE_PROVIDER_SITE_OTHER): Payer: 59

## 2017-07-04 ENCOUNTER — Encounter: Payer: Self-pay | Admitting: Podiatry

## 2017-07-04 ENCOUNTER — Ambulatory Visit (INDEPENDENT_AMBULATORY_CARE_PROVIDER_SITE_OTHER): Payer: 59 | Admitting: Podiatry

## 2017-07-04 VITALS — BP 127/70 | HR 54 | Resp 16

## 2017-07-04 DIAGNOSIS — M7662 Achilles tendinitis, left leg: Secondary | ICD-10-CM

## 2017-07-04 MED ORDER — METHYLPREDNISOLONE 4 MG PO TBPK: ORAL_TABLET | ORAL | 0 refills | Status: DC

## 2017-07-04 MED ORDER — MELOXICAM 15 MG PO TABS: 15.0000 mg | ORAL_TABLET | Freq: Every day | ORAL | 3 refills | Status: DC
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
## 2017-07-04 NOTE — Patient Instructions (Signed)

## 2017-08-06 ENCOUNTER — Ambulatory Visit: Payer: 59 | Admitting: Podiatry

## 2017-08-13 ENCOUNTER — Encounter: Payer: Self-pay | Admitting: Podiatry

## 2017-08-13 ENCOUNTER — Ambulatory Visit (INDEPENDENT_AMBULATORY_CARE_PROVIDER_SITE_OTHER): Payer: 59 | Admitting: Podiatry

## 2017-08-13 DIAGNOSIS — M7662 Achilles tendinitis, left leg: Secondary | ICD-10-CM

## 2017-08-13 DIAGNOSIS — M722 Plantar fascial fibromatosis: Secondary | ICD-10-CM | POA: Diagnosis not present

## 2017-08-14 NOTE — Progress Notes (Signed)
She presents today for follow-up of her Achilles tendinitis to her left foot. She states the medication made all of my my aches and pains go away and I'm feeling great. She states that only had some pain in her left heel.  Objective: Pulses are palpable. She has pain on palpation of medial calcaneal tubercle but none on operation of the Achilles at its insertion site.  Assessment: Resolving Achilles tendinitis mild plantar fasciitis left.  Plan: Injected the left heel today with Kenalog and local anesthetic follow-up with her in 1 month if necessary. I recommended that she continue all conservative therapies including the night splint the oral anti-inflammatory.

## 2017-08-29 ENCOUNTER — Other Ambulatory Visit: Payer: Self-pay | Admitting: Physician Assistant

## 2017-10-02 ENCOUNTER — Other Ambulatory Visit: Payer: Self-pay | Admitting: Physician Assistant

## 2017-10-11 ENCOUNTER — Other Ambulatory Visit: Payer: Self-pay | Admitting: Physician Assistant

## 2017-10-25 DIAGNOSIS — H93293 Other abnormal auditory perceptions, bilateral: Secondary | ICD-10-CM | POA: Diagnosis not present

## 2017-10-25 DIAGNOSIS — H9313 Tinnitus, bilateral: Secondary | ICD-10-CM | POA: Diagnosis not present

## 2017-10-25 DIAGNOSIS — Z0112 Encounter for hearing conservation and treatment: Secondary | ICD-10-CM | POA: Diagnosis not present

## 2017-12-05 ENCOUNTER — Encounter: Payer: Self-pay | Admitting: Internal Medicine
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
## 2017-12-06 ENCOUNTER — Ambulatory Visit: Payer: 59 | Admitting: Internal Medicine

## 2017-12-06 ENCOUNTER — Encounter: Payer: Self-pay | Admitting: Internal Medicine

## 2017-12-06 VITALS — BP 124/70 | HR 61 | Ht 66.0 in | Wt 211.4 lb

## 2017-12-06 DIAGNOSIS — R0789 Other chest pain: Secondary | ICD-10-CM

## 2017-12-06 DIAGNOSIS — E669 Obesity, unspecified: Secondary | ICD-10-CM

## 2017-12-06 DIAGNOSIS — I493 Ventricular premature depolarization: Secondary | ICD-10-CM | POA: Diagnosis not present

## 2017-12-06 HISTORY — DX: Obesity, unspecified: E66.9

## 2017-12-06 NOTE — Patient Instructions (Addendum)
Medication Instructions:  Your physician recommends that you continue on your current medications as directed. Please refer to the Current Medication list given to you today.  -- If you need a refill on your cardiac medications before your next appointment, please call your pharmacy. --  Labwork: None ordered  Testing/Procedures: None ordered  Follow-Up: Your physician wants you to follow-up in: 1 YEAR with APP  You will receive a reminder letter in the mail two months in advance. If you don't receive a letter, please call our office to schedule the follow-up appointment.  Thank you for choosing [facility]!!    Any Other Special Instructions Will Be Listed Below (If Applicable).

## 2017-12-16 ENCOUNTER — Other Ambulatory Visit: Payer: Self-pay | Admitting: Physician Assistant

## 2018-04-27 DIAGNOSIS — J209 Acute bronchitis, unspecified: Secondary | ICD-10-CM | POA: Diagnosis not present

## 2018-07-21 ENCOUNTER — Telehealth: Payer: Self-pay | Admitting: Physician Assistant

## 2018-07-21 DIAGNOSIS — R29810 Facial weakness: Secondary | ICD-10-CM | POA: Diagnosis not present

## 2018-07-21 DIAGNOSIS — E7800 Pure hypercholesterolemia, unspecified: Secondary | ICD-10-CM | POA: Diagnosis not present

## 2018-07-21 DIAGNOSIS — Z79899 Other long term (current) drug therapy: Secondary | ICD-10-CM | POA: Diagnosis not present

## 2018-07-21 DIAGNOSIS — R531 Weakness: Secondary | ICD-10-CM | POA: Diagnosis not present

## 2018-07-21 DIAGNOSIS — R7302 Impaired glucose tolerance (oral): Secondary | ICD-10-CM | POA: Diagnosis not present

## 2018-07-21 NOTE — Telephone Encounter (Signed)
New message   Patient states that she was  having symptoms on 07/19/2018  she was disoriented, dizzy, left side went numb, left side of face was swollen. Please advise.

## 2018-07-21 NOTE — Telephone Encounter (Signed)
Pt calling today to state she had, what she believed, was a stroke on Saturday. She states she was disoriented, had left sided numbness and tingling. She states this morning she still feels disoriented however her numbness and tingling have resolved. I have advised pt to seek medical attention at her closes ED for further evaluation as there is not much we can do here in the office or over the phone. I emphasized the importance of seeking help asap with any stroke symptoms. I educated pt on the time restraint thrombotic stroke treatment and how important it is to head to any ED.  Pt has verbalized understanding and had no additional questions.

## 2018-07-21 NOTE — Telephone Encounter (Signed)
I agree that patient should be taken to the ED as soon as possible.  Nelva Bush, MD [facility] Pager: [phone]

## 2018-07-22 DIAGNOSIS — R29810 Facial weakness: Secondary | ICD-10-CM | POA: Diagnosis not present

## 2018-07-23 DIAGNOSIS — R51 Headache: Secondary | ICD-10-CM | POA: Diagnosis not present

## 2018-07-23 DIAGNOSIS — R072 Precordial pain: Secondary | ICD-10-CM | POA: Diagnosis not present

## 2018-07-23 DIAGNOSIS — G459 Transient cerebral ischemic attack, unspecified: Secondary | ICD-10-CM | POA: Diagnosis not present

## 2018-07-23 DIAGNOSIS — R2990 Unspecified symptoms and signs involving the nervous system: Secondary | ICD-10-CM | POA: Diagnosis not present

## 2018-07-23 DIAGNOSIS — M25552 Pain in left hip: Secondary | ICD-10-CM | POA: Diagnosis not present

## 2018-07-23 DIAGNOSIS — R4701 Aphasia: Secondary | ICD-10-CM | POA: Diagnosis not present

## 2018-07-23 DIAGNOSIS — R079 Chest pain, unspecified: Secondary | ICD-10-CM | POA: Diagnosis not present

## 2018-07-23 DIAGNOSIS — R0789 Other chest pain: Secondary | ICD-10-CM | POA: Diagnosis not present

## 2018-07-23 DIAGNOSIS — R001 Bradycardia, unspecified: Secondary | ICD-10-CM | POA: Diagnosis not present

## 2018-07-23 DIAGNOSIS — R410 Disorientation, unspecified: Secondary | ICD-10-CM | POA: Diagnosis not present

## 2018-07-23 DIAGNOSIS — I493 Ventricular premature depolarization: Secondary | ICD-10-CM | POA: Diagnosis not present

## 2018-07-24 DIAGNOSIS — R479 Unspecified speech disturbances: Secondary | ICD-10-CM | POA: Diagnosis not present

## 2018-07-24 DIAGNOSIS — Z7982 Long term (current) use of aspirin: Secondary | ICD-10-CM | POA: Diagnosis not present

## 2018-07-24 DIAGNOSIS — G459 Transient cerebral ischemic attack, unspecified: Secondary | ICD-10-CM | POA: Diagnosis not present

## 2018-07-24 DIAGNOSIS — R2990 Unspecified symptoms and signs involving the nervous system: Secondary | ICD-10-CM | POA: Diagnosis not present

## 2018-07-24 DIAGNOSIS — R0789 Other chest pain: Secondary | ICD-10-CM | POA: Diagnosis not present

## 2018-07-25 ENCOUNTER — Encounter: Payer: Self-pay | Admitting: Cardiology

## 2018-07-28 ENCOUNTER — Telehealth: Payer: Self-pay

## 2018-07-28 ENCOUNTER — Encounter: Payer: Self-pay | Admitting: Cardiology

## 2018-07-28 ENCOUNTER — Ambulatory Visit: Payer: 59 | Admitting: Cardiology

## 2018-07-28 VITALS — BP 126/78 | HR 57 | Ht 66.0 in | Wt 202.8 lb

## 2018-07-28 DIAGNOSIS — G459 Transient cerebral ischemic attack, unspecified: Secondary | ICD-10-CM | POA: Diagnosis not present

## 2018-07-28 DIAGNOSIS — R002 Palpitations: Secondary | ICD-10-CM
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
## 2018-07-28 NOTE — Telephone Encounter (Signed)
Spoke with pt in regards to referral for neurology for her TIA's . I have put referral in.

## 2018-07-28 NOTE — Patient Instructions (Signed)
Medication Instructions:  Your physician recommends that you continue on your current medications as directed. Please refer to the Current Medication list given to you today.   If you need a refill on your cardiac medications before your next appointment, please call your pharmacy.   Lab work: None  If you have labs (blood work) drawn today and your tests are completely normal, you will receive your results only by: [name] Kitchen MyChart Message (if you have MyChart) OR . A paper copy in the mail If you have any lab test that is abnormal or we need to change your treatment, we will call you to review the results.  Testing/Procedures: Your physician has recommended that you wear an event monitor. Event monitors are medical devices that record the heart's electrical activity. Doctors most often [place] these monitors to diagnose arrhythmias. Arrhythmias are problems with the speed or rhythm of the heartbeat. The monitor is a small, portable device. You can wear one while you do your normal daily activities. This is usually used to diagnose what is causing palpitations/syncope (passing out).    Follow-Up: Follow up with Carlye Grippe after you are done with your monitor   Any Other Special Instructions Will Be Listed Below (If Applicable).   Cardiac Event Monitoring A cardiac event monitor is a small recording device that is used to detect abnormal heart rhythms (arrhythmias). The monitor is used to record your heart rhythm when you have symptoms, such as:  Fast heartbeats (palpitations), such as heart racing or fluttering.  Dizziness.  Fainting or light-headedness.  Unexplained weakness.  Some monitors are wired to electrodes placed on your chest. Electrodes are flat, sticky disks that attach to your skin. Other monitors may be hand-held or worn on the wrist. The monitor can be worn for up to 30 days. If the monitor is attached to your chest, a technician will prepare your chest for the electrode  placement and show you how to work the monitor. Take time to practice using the monitor before you leave the office. Make sure you understand how to send the information from the monitor to your health care provider. In some cases, you may need to use a landline telephone instead of a cell phone. What are the risks? Generally, this device is safe to use, but it possible that the skin under the electrodes will become irritated. How to use your cardiac event monitor  Wear your monitor at all times, except when you are in water: ? Do not let the monitor get wet. ? Take the monitor off when you bathe. Do not swim or use a hot tub with it on.  Keep your skin clean. Do not put body lotion or moisturizer on your chest.  Change the electrodes as told by your health care provider or any time they stop sticking to your skin. You may need to use medical tape to keep them on.  Try to put the electrodes in slightly different places on your chest to help prevent skin irritation. They must remain in the area under your left breast and in the upper right section of your chest.  Make sure the monitor is safely clipped to your clothing or in a location close to your body that your health care provider recommends.  Press the button to record as soon as you feel heart-related symptoms, such as: ? Dizziness. ? Weakness. ? Light-headedness. ? Palpitations. ? Thumping or pounding in your chest. ? Shortness of breath. ? Unexplained weakness.  Keep a  diary of your activities, such as walking, doing chores, and taking medicine. It is very important to note what you were doing when you pushed the button to record your symptoms. This will help your health care provider determine what might be contributing to your symptoms.  Send the recorded information as recommended by your health care provider. It may take some time for your health care provider to process the results.  Change the batteries as told by your  health care provider.  Keep electronic devices away from your monitor. This includes: ? Tablets. ? MP3 players. ? Cell phones.  While wearing your monitor you should avoid: ? Electric blankets. ? Armed forces operational officer. ? Electric toothbrushes. ? Microwave ovens. ? Magnets. ? Metal detectors. Get help right away if:  You have chest pain.  You have extreme difficulty breathing or shortness of breath.  You develop a very fast heartbeat that persists.  You develop dizziness that does not go away.  You faint or constantly feel like you are about to faint. Summary  A cardiac event monitor is a small recording device that is used to help detect abnormal heart rhythms (arrhythmias).  The monitor is used to record your heart rhythm when you have heart-related symptoms.  Make sure you understand how to send the information from the monitor to your health care provider.  It is important to press the button on the monitor when you have any heart-related symptoms.  Keep a diary of your activities, such as walking, doing chores, and taking medicine. It is very important to note what you were doing when you pushed the button to record your symptoms. This will help your health care provider learn what might be causing your symptoms. This information is not intended to replace advice given to you by your health care provider. Make sure you discuss any questions you have with your health care provider. Document Released: 07/10/2008 Document Revised: 09/15/2016 Document Reviewed: 09/15/2016 Elsevier Interactive Patient Education  2017 [employer].

## 2018-07-29 ENCOUNTER — Ambulatory Visit: Payer: 59 | Admitting: Physician Assistant

## 2018-07-31 ENCOUNTER — Ambulatory Visit (INDEPENDENT_AMBULATORY_CARE_PROVIDER_SITE_OTHER): Payer: 59

## 2018-07-31 DIAGNOSIS — R002 Palpitations: Secondary | ICD-10-CM

## 2018-07-31 DIAGNOSIS — G459 Transient cerebral ischemic attack, unspecified: Secondary | ICD-10-CM

## 2018-08-01 DIAGNOSIS — I493 Ventricular premature depolarization: Secondary | ICD-10-CM | POA: Diagnosis not present

## 2018-08-01 DIAGNOSIS — G459 Transient cerebral ischemic attack, unspecified: Secondary | ICD-10-CM | POA: Diagnosis not present

## 2018-08-01 DIAGNOSIS — I480 Paroxysmal atrial fibrillation: Secondary | ICD-10-CM | POA: Diagnosis not present

## 2018-08-04 DIAGNOSIS — Z1231 Encounter for screening mammogram for malignant neoplasm of breast: Secondary | ICD-10-CM | POA: Diagnosis not present

## 2018-08-07 DIAGNOSIS — N6320 Unspecified lump in the left breast, unspecified quadrant: Secondary | ICD-10-CM | POA: Diagnosis not present

## 2018-08-07 DIAGNOSIS — N6323 Unspecified lump in the left breast, lower outer quadrant: Secondary | ICD-10-CM | POA: Diagnosis not present

## 2018-08-20 ENCOUNTER — Encounter: Payer: Self-pay | Admitting: Neurology

## 2018-08-20 ENCOUNTER — Ambulatory Visit: Payer: 59 | Admitting: Neurology

## 2018-08-20 VITALS — BP 125/82 | HR 55 | Ht 66.0 in | Wt 206.0 lb

## 2018-08-20 DIAGNOSIS — G43009 Migraine without aura, not intractable, without status migrainosus: Secondary | ICD-10-CM

## 2018-08-20 DIAGNOSIS — Q211 Atrial septal defect: Secondary | ICD-10-CM | POA: Diagnosis not present

## 2018-08-20 DIAGNOSIS — Q2112 Patent foramen ovale: Secondary | ICD-10-CM

## 2018-08-20 DIAGNOSIS — R51 Headache: Secondary | ICD-10-CM

## 2018-08-20 DIAGNOSIS — G459 Transient cerebral ischemic attack, unspecified: Secondary | ICD-10-CM | POA: Diagnosis not present

## 2018-08-20 DIAGNOSIS — R519 Headache, unspecified: Secondary | ICD-10-CM

## 2018-08-20 DIAGNOSIS — G8929 Other chronic pain: Secondary | ICD-10-CM

## 2018-08-20 MED ORDER — TOPIRAMATE ER 50 MG PO CAP24: 50.0000 mg | ORAL_CAPSULE | ORAL | 1 refills | Status: DC
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
## 2018-08-20 NOTE — Patient Instructions (Signed)
I had a long discussion with the patient with regards to her recent episodes of left-sided paresthesias, weakness, dizziness and disorientation in setting of headache likely representing episodes of complicated migraine.  I recommend she continue aspirin for stroke prevention and stay on atenolol.  I recommend we add Trokendi XR 50 mg daily for migraine prophylaxis.  I discussed side effects with the patient.  I have also advised her to avoid this stress which seems to be a trigger for migraines.  She was advised to increase participation in regular activities for stress relaxation like swimming, meditation, yoga and exercise.  Check transcranial Doppler bubble study for PFO.  She will return for follow-up in 2 months or call earlier if necessary.  Migraine Headache A migraine headache is a very strong throbbing pain on one side or both sides of your head. Migraines can also cause other symptoms. Talk with your doctor about what things may bring on (trigger) your migraine headaches. Follow these instructions at home: Medicines  Take over-the-counter and prescription medicines only as told by your doctor.  Do not drive or use heavy machinery while taking prescription pain medicine.  To prevent or treat constipation while you are taking prescription pain medicine, your doctor may recommend that you: ? Drink enough fluid to keep your pee (urine) clear or pale yellow. ? Take over-the-counter or prescription medicines. ? Eat foods that are high in fiber. These include fresh fruits and vegetables, whole grains, and beans. ? Limit foods that are high in fat and processed sugars. These include fried and sweet foods. Lifestyle  Avoid alcohol.  Do not use any products that contain nicotine or tobacco, such as cigarettes and e-cigarettes. If you need help quitting, ask your doctor.  Get at least 8 hours of sleep every night.  Limit your stress. General instructions   Keep a journal to find out what  may bring on your migraines. For example, write down: ? What you eat and drink. ? How much sleep you get. ? Any change in what you eat or drink. ? Any change in your medicines.  If you have a migraine: ? Avoid things that make your symptoms worse, such as bright lights. ? It may help to lie down in a dark, quiet room. ? Do not drive or use heavy machinery. ? Ask your doctor what activities are safe for you.  Keep all follow-up visits as told by your doctor. This is important. Contact a doctor if:  You get a migraine that is different or worse than your usual migraines. Get help right away if:  Your migraine gets very bad.  You have a fever.  You have a stiff neck.  You have trouble seeing.  Your muscles feel weak or like you cannot control them.  You start to lose your balance a lot.  You start to have trouble walking.  You pass out (faint). This information is not intended to replace advice given to you by your health care provider. Make sure you discuss any questions you have with your health care provider. Document Released: 07/10/2008 Document Revised: 04/20/2016 Document Reviewed: 03/19/2016 Elsevier Interactive Patient Education  2018 [employer].

## 2018-08-25 DIAGNOSIS — R5383 Other fatigue: Secondary | ICD-10-CM | POA: Diagnosis not present

## 2018-08-25 DIAGNOSIS — G459 Transient cerebral ischemic attack, unspecified: Secondary | ICD-10-CM | POA: Diagnosis not present

## 2018-08-25 DIAGNOSIS — R001 Bradycardia, unspecified: Secondary | ICD-10-CM | POA: Diagnosis not present

## 2018-09-03 ENCOUNTER — Telehealth: Payer: Self-pay | Admitting: Neurology

## 2018-09-03 ENCOUNTER — Other Ambulatory Visit: Payer: Self-pay

## 2018-09-03 MED ORDER — TOPIRAMATE ER 50 MG PO CAP24: ORAL_CAPSULE | ORAL | 1 refills | Status: DC

## 2018-09-03 NOTE — Telephone Encounter (Signed)
RN put order back in for trokendi. PT stated pharmacy stated it was hold due to PA. RN stated no PA was sent to our office to be done. RN recommend she seek pharmacist again and give them co pay card. PT verbalized understanding.

## 2018-09-03 NOTE — Telephone Encounter (Signed)
Pt requesting a call stating she hasn't been able to get medication Topiramate ER (TROKENDI XR) 50 MG CP24(Expired). Please call to advise

## 2018-09-04 ENCOUNTER — Ambulatory Visit: Payer: 59 | Admitting: Cardiology

## 2018-09-05 ENCOUNTER — Telehealth: Payer: Self-pay | Admitting: Cardiology

## 2018-09-05 NOTE — Telephone Encounter (Signed)
I called Savannah Dennis to discuss preliminary monitor results. No afib noted. Avg HR is a little low, ~58-60 with PVC's not an overall large amt- most I saw was 7 in a minute. She says that she is feeling gradually better since the TIA. She has seen neurology and has a transcranial bubble study next week. Migraine vs TIA. Neurology recommends to continue atenolol. The patient agrees to continue to monitor her symptoms and we will revaluate at there follow up on 12/9. Will consider an ETT to evaluate HR response to exercise.

## 2018-09-05 NOTE — Telephone Encounter (Signed)
Patient would like monitor results. 

## 2018-09-09 ENCOUNTER — Ambulatory Visit (HOSPITAL_COMMUNITY)
Admission: RE | Admit: 2018-09-09 | Discharge: 2018-09-09 | Disposition: A | Discharge status: Home / Self Care | Payer: 59 | Source: Ambulatory Visit | Attending: Neurology | Admitting: Neurology

## 2018-09-09 DIAGNOSIS — Q211 Atrial septal defect: Secondary | ICD-10-CM | POA: Diagnosis not present

## 2018-09-09 DIAGNOSIS — Q2112 Patent foramen ovale: Secondary | ICD-10-CM

## 2018-09-09 NOTE — Progress Notes (Signed)
Transcranial doppler with bubbles has been completed. Dr. Leonie Man performed this study. It appears to be positive for a large PFO with valsalva.  09/09/18 2:54 PM Carlos Levering RVT

## 2018-09-22 ENCOUNTER — Encounter: Payer: Self-pay | Admitting: Cardiology

## 2018-09-22 ENCOUNTER — Ambulatory Visit: Payer: 59 | Admitting: Cardiology

## 2018-09-22 VITALS — BP 136/80 | HR 54 | Ht 66.0 in | Wt 210.0 lb

## 2018-09-22 DIAGNOSIS — R0789 Other chest pain: Secondary | ICD-10-CM | POA: Diagnosis not present

## 2018-09-22 DIAGNOSIS — I493 Ventricular premature depolarization: Secondary | ICD-10-CM

## 2018-09-22 DIAGNOSIS — Z8249 Family history of ischemic heart disease and other diseases of the circulatory system: Secondary | ICD-10-CM

## 2018-09-22 DIAGNOSIS — Q211 Atrial septal defect: Secondary | ICD-10-CM

## 2018-09-22 DIAGNOSIS — R079 Chest pain, unspecified: Secondary | ICD-10-CM | POA: Diagnosis not present

## 2018-09-22 DIAGNOSIS — Q2112 Patent foramen ovale: Secondary | ICD-10-CM

## 2018-09-22 LAB — LIPID PANEL
CHOLESTEROL TOTAL: 154 mg/dL (ref 100–199)
Chol/HDL Ratio: 2.7 ratio (ref 0.0–4.4)
HDL: 57 mg/dL (ref >39)
LDL Calculated: 82 mg/dL (ref 0–99)
Triglycerides: 75 mg/dL (ref 0–149)
VLDL Cholesterol Cal: 15 mg/dL (ref 5–40)

## 2018-09-22 NOTE — Patient Instructions (Signed)
Medication Instructions:  Your physician recommends that you continue on your current medications as directed. Please refer to the Current Medication list given to you today.  If you need a refill on your cardiac medications before your next appointment, please call your pharmacy.   Lab work: TODAY: LIPIDS   If you have labs (blood work) drawn today and your tests are completely normal, you will receive your results only by: [name] Kitchen MyChart Message (if you have MyChart) OR . A paper copy in the mail If you have any lab test that is abnormal or we need to change your treatment, we will call you to review the results.  Testing/Procedures: Your physician has requested that you have a stress echocardiogram. For further information please visit [email or web address]. Please follow instruction sheet as given.  You are scheduled to have a CT Calcium score on 09/23/18 @ 1:30 PM    Follow-Up: At [facility], you and your health needs are our priority.  As part of our continuing mission to provide you with exceptional heart care, we have created designated Provider Care Teams.  These Care Teams include your primary Cardiologist (physician) and Advanced Practice Providers (APPs -  Physician Assistants and Nurse Practitioners) who all work together to provide you with the care you need, when you need it. You will need a follow up appointment in:  3 weeks.You may see Dr. Harrington Challenger or one of the following Advanced Practice Providers on your designated Care Team: Richardson Dopp, PA-C [place], [place] . Daune Perch, NP  Any Other Special Instructions Will Be Listed Below (If Applicable).
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
## 2018-09-23 ENCOUNTER — Ambulatory Visit (INDEPENDENT_AMBULATORY_CARE_PROVIDER_SITE_OTHER)
Admission: RE | Admit: 2018-09-23 | Discharge: 2018-09-23 | Disposition: A | Discharge status: Home / Self Care | Payer: Self-pay | Source: Ambulatory Visit | Attending: Cardiology | Admitting: Cardiology

## 2018-09-23 DIAGNOSIS — Z8249 Family history of ischemic heart disease and other diseases of the circulatory system: Secondary | ICD-10-CM

## 2018-09-25 ENCOUNTER — Telehealth (HOSPITAL_COMMUNITY): Payer: Self-pay | Admitting: *Deleted

## 2018-09-25 NOTE — Telephone Encounter (Signed)
Patient given detailed instructions per Stress Test Requisition Sheet for test on 10/02/18 at 2:00.Patient Notified to arrive 30 minutes early, and that it is imperative to arrive on time for appointment to keep from having the test rescheduled.  Patient verbalized understanding. Savannah Dennis

## 2018-09-26 ENCOUNTER — Other Ambulatory Visit (HOSPITAL_COMMUNITY): Payer: 59

## 2018-10-02 ENCOUNTER — Ambulatory Visit (HOSPITAL_COMMUNITY): Payer: 59

## 2018-10-02 ENCOUNTER — Other Ambulatory Visit (HOSPITAL_COMMUNITY): Payer: 59

## 2018-10-02 ENCOUNTER — Ambulatory Visit (HOSPITAL_COMMUNITY): Payer: 59 | Attending: Cardiology

## 2018-10-02 DIAGNOSIS — R0789 Other chest pain: Secondary | ICD-10-CM | POA: Diagnosis not present

## 2018-10-15 DIAGNOSIS — Q2112 Patent foramen ovale: Secondary | ICD-10-CM

## 2018-10-15 HISTORY — PX: UPPER GASTROINTESTINAL ENDOSCOPY: SHX188

## 2018-10-15 HISTORY — DX: Patent foramen ovale: Q21.12
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
## 2018-10-22 ENCOUNTER — Ambulatory Visit (INDEPENDENT_AMBULATORY_CARE_PROVIDER_SITE_OTHER): Payer: BLUE CROSS/BLUE SHIELD | Admitting: Cardiology

## 2018-10-22 ENCOUNTER — Telehealth: Payer: Self-pay | Admitting: Cardiology

## 2018-10-22 ENCOUNTER — Encounter: Payer: Self-pay | Admitting: Cardiology

## 2018-10-22 VITALS — BP 126/76 | HR 65 | Ht 66.0 in | Wt 211.4 lb

## 2018-10-22 DIAGNOSIS — R002 Palpitations: Secondary | ICD-10-CM

## 2018-10-22 MED ORDER — PINDOLOL 5 MG PO TABS: 5.0000 mg | ORAL_TABLET | Freq: Two times a day (BID) | ORAL | 3 refills | Status: DC

## 2018-10-22 NOTE — Telephone Encounter (Signed)
Pt was to see Dr. Harrington Challenger for follow up. She has more complex questions than I can answer. All of her cardiac diagnostics so far have been unrevealing for the cause of her symptoms. I discussed this with the patient and her husband who is very concerned that we are missing something. I will have the patient seen by Dr. Harrington Challenger on Friday.  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 Will try switching to pindolol as it does not lower baseline heart rate as much while suppressing PVCs.   Daune Perch, AGNP-C [facility] 10/22/2018  10:49 AM

## 2018-10-22 NOTE — Patient Instructions (Signed)
Medication Instructions:  STOP: Atenolol   START: Pindolol 5 mg twice a day  If you need a refill on your cardiac medications before your next appointment, please call your pharmacy.   Lab work: None  If you have labs (blood work) drawn today and your tests are completely normal, you will receive your results only by: [name] Kitchen MyChart Message (if you have MyChart) OR . A paper copy in the mail If you have any lab test that is abnormal or we need to change your treatment, we will call you to review the results.  Testing/Procedures: None   Follow-Up: You are scheduled to see Dr. Harrington Challenger on 10/24/18 @ 8:20 Am   Any Other Special Instructions Will Be Listed Below (If Applicable).

## 2018-10-23 ENCOUNTER — Other Ambulatory Visit: Payer: Self-pay | Admitting: Internal Medicine

## 2018-10-24 ENCOUNTER — Encounter: Payer: Self-pay | Admitting: Internal Medicine

## 2018-10-24 ENCOUNTER — Encounter: Payer: Self-pay | Admitting: *Deleted

## 2018-10-24 ENCOUNTER — Ambulatory Visit: Payer: BLUE CROSS/BLUE SHIELD | Admitting: Internal Medicine

## 2018-10-24 VITALS — BP 116/64 | HR 68 | Ht 66.0 in | Wt 214.0 lb

## 2018-10-24 DIAGNOSIS — I493 Ventricular premature depolarization: Secondary | ICD-10-CM | POA: Diagnosis not present

## 2018-10-24 MED ORDER — ATENOLOL 25 MG PO TABS: 12.5000 mg | ORAL_TABLET | Freq: Every day | ORAL | 3 refills | Status: DC
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
## 2018-10-24 NOTE — Telephone Encounter (Signed)
Please review for refill. Did not see on current medication list.
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
## 2018-10-24 NOTE — Patient Instructions (Signed)
Medication Instructions:  Your physician has recommended you make the following change in your medication:  1.) stop pindolol 2.) restart atenolol - take 1/2 tablet (12.5 mg) once a day  If you need a refill on your cardiac medications before your next appointment, please call your pharmacy.   Lab work: Today: bmet, cbc If you have labs (blood work) drawn today and your tests are completely normal, you will receive your results only by: [name] Kitchen MyChart Message (if you have MyChart) OR . A paper copy in the mail If you have any lab test that is abnormal or we need to change your treatment, we will call you to review the results.  Testing/Procedures: Your physician has requested that you have a TEE. During a TEE, sound waves are used to create images of your heart. It provides your doctor with information about the size and shape of your heart and how well your heart's chambers and valves are working. In this test, a transducer is attached to the end of a flexible tube that's guided down your throat and into your esophagus (the tube leading from you mouth to your stomach) to get a more detailed image of your heart. You are not awake for the procedure. Please see the instruction sheet given to you today. For further information please visit [email or web address].tn. Your physician has recommended that you wear a holter monitor. Holter monitors are medical devices that record the heart's electrical activity. Doctors most often use these monitors to diagnose arrhythmias. Arrhythmias are problems with the speed or rhythm of the heartbeat. The monitor is a small, portable device. You can wear one while you do your normal daily activities. This is usually used to diagnose what is causing palpitations/syncope (passing out).   Follow-Up: After testing we will decide follow up   Any Other Special Instructions Will Be Listed Below (If Applicable).

## 2018-10-25 LAB — CBC
Hematocrit: 42.5 % (ref 34.0–46.6)
Hemoglobin: 14.2 g/dL (ref 11.1–15.9)
MCH: 30.7 pg (ref 26.6–33.0)
MCHC: 33.4 g/dL (ref 31.5–35.7)
MCV: 92 fL (ref 79–97)
PLATELETS: 252 10*3/uL (ref 150–450)
RBC: 4.62 x10E6/uL (ref 3.77–5.28)
RDW: 12.3 % (ref 11.7–15.4)
WBC: 8.4 10*3/uL (ref 3.4–10.8)

## 2018-10-25 LAB — BASIC METABOLIC PANEL
BUN/Creatinine Ratio: 23 (ref 9–23)
BUN: 18 mg/dL (ref 6–24)
CO2: 19 mmol/L — ABNORMAL LOW (ref 20–29)
Calcium: 9.7 mg/dL (ref 8.7–10.2)
Chloride: 106 mmol/L (ref 96–106)
Creatinine, Ser: 0.77 mg/dL (ref 0.57–1.00)
GFR calc Af Amer: 105 mL/min/{1.73_m2} (ref >59)
GFR calc non Af Amer: 91 mL/min/{1.73_m2} (ref >59)
Glucose: 98 mg/dL (ref 65–99)
Potassium: 4.5 mmol/L (ref 3.5–5.2)
Sodium: 139 mmol/L (ref 134–144)

## 2018-10-29 ENCOUNTER — Ambulatory Visit: Payer: 59 | Admitting: Neurology

## 2018-10-30 ENCOUNTER — Encounter (HOSPITAL_COMMUNITY): Payer: Self-pay | Admitting: *Deleted

## 2018-10-30 ENCOUNTER — Other Ambulatory Visit: Payer: Self-pay

## 2018-10-30 ENCOUNTER — Ambulatory Visit (HOSPITAL_BASED_OUTPATIENT_CLINIC_OR_DEPARTMENT_OTHER): Payer: BLUE CROSS/BLUE SHIELD

## 2018-10-30 ENCOUNTER — Encounter (HOSPITAL_COMMUNITY)
Admission: RE | Disposition: A | Discharge status: Home / Self Care | Payer: Self-pay | Source: Home / Self Care | Attending: Internal Medicine

## 2018-10-30 ENCOUNTER — Ambulatory Visit (HOSPITAL_COMMUNITY)
Admission: RE | Admit: 2018-10-30 | Discharge: 2018-10-30 | Disposition: A | Discharge status: Home / Self Care | Payer: BLUE CROSS/BLUE SHIELD | Attending: Internal Medicine | Admitting: Internal Medicine

## 2018-10-30 DIAGNOSIS — R5383 Other fatigue: Secondary | ICD-10-CM | POA: Diagnosis not present

## 2018-10-30 DIAGNOSIS — Q211 Atrial septal defect: Secondary | ICD-10-CM | POA: Diagnosis not present

## 2018-10-30 DIAGNOSIS — F419 Anxiety disorder, unspecified: Secondary | ICD-10-CM | POA: Insufficient documentation

## 2018-10-30 DIAGNOSIS — Z79899 Other long term (current) drug therapy: Secondary | ICD-10-CM | POA: Diagnosis not present

## 2018-10-30 DIAGNOSIS — Z881 Allergy status to other antibiotic agents status: Secondary | ICD-10-CM | POA: Insufficient documentation

## 2018-10-30 DIAGNOSIS — Z7982 Long term (current) use of aspirin: Secondary | ICD-10-CM | POA: Diagnosis not present

## 2018-10-30 DIAGNOSIS — R0789 Other chest pain: Secondary | ICD-10-CM | POA: Insufficient documentation

## 2018-10-30 DIAGNOSIS — Z880 Allergy status to penicillin: Secondary | ICD-10-CM | POA: Diagnosis not present

## 2018-10-30 DIAGNOSIS — I639 Cerebral infarction, unspecified: Secondary | ICD-10-CM | POA: Diagnosis not present

## 2018-10-30 DIAGNOSIS — Z87891 Personal history of nicotine dependence: Secondary | ICD-10-CM | POA: Diagnosis not present

## 2018-10-30 HISTORY — PX: TEE WITHOUT CARDIOVERSION: SHX5443

## 2018-10-30 SURGERY — ECHOCARDIOGRAM, TRANSESOPHAGEAL
Anesthesia: Moderate Sedation

## 2018-10-30 MED ORDER — FENTANYL CITRATE (PF) 100 MCG/2ML IJ SOLN
INTRAMUSCULAR | Status: AC
  Filled 2018-10-30: qty 2

## 2018-10-30 MED ORDER — FENTANYL CITRATE (PF) 100 MCG/2ML IJ SOLN
INTRAMUSCULAR | Status: DC | PRN
  Administered 2018-10-30 (×4): 25 ug via INTRAVENOUS

## 2018-10-30 MED ORDER — MIDAZOLAM HCL (PF) 5 MG/ML IJ SOLN
INTRAMUSCULAR | Status: AC
  Filled 2018-10-30: qty 2

## 2018-10-30 MED ORDER — SODIUM CHLORIDE 0.9 % IV SOLN
INTRAVENOUS | Status: DC
  Administered 2018-10-30: 08:00:00 via INTRAVENOUS

## 2018-10-30 MED ORDER — MIDAZOLAM HCL (PF) 10 MG/2ML IJ SOLN
INTRAMUSCULAR | Status: DC | PRN
  Administered 2018-10-30 (×2): 1 mg via INTRAVENOUS
  Administered 2018-10-30: 2 mg via INTRAVENOUS
  Administered 2018-10-30: 1 mg via INTRAVENOUS

## 2018-10-30 NOTE — Discharge Instructions (Signed)

## 2018-10-30 NOTE — Interval H&P Note (Signed)
History and Physical Interval Note:  10/30/2018 9:36 AM  Savannah Dennis  has presented today for surgery, with the diagnosis of hx of tia  The various methods of treatment have been discussed with the patient and family. After consideration of risks, benefits and other options for treatment, the patient has consented to  Procedure(s): TRANSESOPHAGEAL ECHOCARDIOGRAM (TEE) (N/A) as a surgical intervention .  The patient's history has been reviewed, patient examined, no change in status, stable for surgery.  I have reviewed the patient's chart and labs.  Questions were answered to the patient's satisfaction.     Elouise Munroe

## 2018-10-30 NOTE — Progress Notes (Signed)
  Echocardiogram 2D Echocardiogram has been performed.  Savannah Dennis 10/30/2018, 10:16 AM

## 2018-10-30 NOTE — CV Procedure (Addendum)
INDICATIONS: PFO  PROCEDURE:   Informed consent was obtained prior to the procedure. The risks, benefits and alternatives for the procedure were discussed and the patient comprehended these risks.  Risks include, but are not limited to, cough, sore throat, vomiting, nausea, somnolence, esophageal and stomach trauma or perforation, bleeding, low blood pressure, aspiration, pneumonia, infection, trauma to the teeth and death.    After a procedural time-out, the oropharynx was anesthetized with 20% benzocaine spray.   During this procedure the patient was administered a total of Versed 5 mg and Fentanyl 100 mg to achieve and maintain moderate conscious sedation.  The patient's heart rate, blood pressure, and oxygen saturationweare monitored continuously during the procedure. The period of conscious sedation was 30 minutes, of which I was present face-to-face 100% of this time.  The transesophageal probe was inserted in the esophagus and stomach without difficulty and multiple views were obtained.  The patient was kept under observation until the patient left the procedure room.  The patient left the procedure room in stable condition.   Agitated microbubble saline contrast was administered.  COMPLICATIONS:    There were no immediate complications.  FINDINGS:  Patent foramen ovale with R-L shunt.  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 Trivial mitral valve regurgitation.  RECOMMENDATIONS:    F/u with primary cardiologist.   Consider further evaluation for mitral annular disjunction which may be contributing to PVCs.   Time Spent Directly with the Patient:  60 minutes   Elouise Munroe 10/30/2018, 10:03 AM

## 2018-11-03 ENCOUNTER — Encounter (HOSPITAL_COMMUNITY): Payer: Self-pay | Admitting: Internal Medicine

## 2018-11-08 ENCOUNTER — Other Ambulatory Visit: Payer: Self-pay | Admitting: Internal Medicine

## 2018-11-10 ENCOUNTER — Other Ambulatory Visit: Payer: Self-pay

## 2018-11-10 ENCOUNTER — Ambulatory Visit: Payer: BLUE CROSS/BLUE SHIELD | Admitting: Neurology

## 2018-11-10 ENCOUNTER — Encounter: Payer: Self-pay | Admitting: Neurology

## 2018-11-10 VITALS — BP 118/79 | HR 64 | Wt 218.6 lb

## 2018-11-10 DIAGNOSIS — Q211 Atrial septal defect: Secondary | ICD-10-CM | POA: Diagnosis not present

## 2018-11-10 DIAGNOSIS — Q2112 Patent foramen ovale: Secondary | ICD-10-CM

## 2018-11-10 MED ORDER — TOPIRAMATE ER 50 MG PO CAP24: 100.0000 mg | ORAL_CAPSULE | ORAL | 1 refills | Status: DC

## 2018-11-10 MED ORDER — FREMANEZUMAB-VFRM 225 MG/1.5ML ~~LOC~~ SOSY: 225.0000 mg | PREFILLED_SYRINGE | SUBCUTANEOUS | 3 refills | Status: DC

## 2018-11-10 MED ORDER — FREMANEZUMAB-VFRM 225 MG/1.5ML ~~LOC~~ SOSY: 225.0000 mg | PREFILLED_SYRINGE | SUBCUTANEOUS | Status: DC
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
## 2018-11-10 NOTE — Patient Instructions (Addendum)
I had a long discussion with the patient and her husband and discussed findings of transcranial Doppler bubble study and transesophageal echocardiogram confirming a large PFO and discuss results of recent studies suggesting possible benefit of endovascular PFO closure in patients with cryptogenic strokes though we do not have definitive data about patients with TIAs or complicated migraines whether PFO closure is beneficial.  She has a ROPE score of 6 points giving her a 62% chance that her TIA is likely related to her PFO recommend referral to Dr. Burt Knack to discuss endovascular PFO closure and risk benefit.  Increased Trokendi XR 200 mg daily for migraine prophylaxis as well as start Ajovy injection 225 mg once monthly for migraine prophylaxis.  I advised her to limit using migraine Excedrin to avoid analgesic rebound.  She will return for follow-up in 3 months or call earlier if necessary.

## 2018-11-10 NOTE — Telephone Encounter (Signed)
Please review for refill. Thank you! 

## 2018-11-11 ENCOUNTER — Ambulatory Visit (INDEPENDENT_AMBULATORY_CARE_PROVIDER_SITE_OTHER): Payer: BLUE CROSS/BLUE SHIELD

## 2018-11-11 DIAGNOSIS — I493 Ventricular premature depolarization: Secondary | ICD-10-CM | POA: Diagnosis not present

## 2018-11-12 ENCOUNTER — Telehealth: Payer: Self-pay

## 2018-11-12 NOTE — Telephone Encounter (Signed)
-----   Message from Sherren Mocha, MD sent at 11/12/2018  6:25 AM EST ----- Max Fickle will get her in for consultation. Ronalee Belts ----- Message ----- From: Garvin Fila, MD Sent: 11/10/2018   2:18 PM EST To: Sherren Mocha, MD

## 2018-11-12 NOTE — Telephone Encounter (Signed)
Scheduled patient for consult with Dr. Burt Knack this Friday, January 31 at 0800. The patient was grateful for assistance.

## 2018-11-13 NOTE — Progress Notes (Signed)
I agree with the above plan 

## 2018-11-14 ENCOUNTER — Ambulatory Visit: Payer: BLUE CROSS/BLUE SHIELD | Admitting: Cardiovascular Disease

## 2018-11-14 ENCOUNTER — Encounter: Payer: Self-pay | Admitting: Cardiovascular Disease

## 2018-11-14 VITALS — BP 108/76 | HR 61 | Ht 66.0 in | Wt 216.4 lb

## 2018-11-14 DIAGNOSIS — Q211 Atrial septal defect: Secondary | ICD-10-CM

## 2018-11-14 DIAGNOSIS — Q2112 Patent foramen ovale: Secondary | ICD-10-CM

## 2018-11-14 MED ORDER — CLOPIDOGREL BISULFATE 75 MG PO TABS: 75.0000 mg | ORAL_TABLET | Freq: Every day | ORAL | 11 refills | Status: DC
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
## 2018-11-14 NOTE — Patient Instructions (Addendum)
Medication Instructions:  1) START PLAVIX 75 mg daily on February 15.  Labwork: TODAY: BMET, CBC  Testing/Procedures: Dr. Burt Knack recommends you have a PFO CLOSURE.  Follow-Up: You have follow-up scheduled with Dr. Antionette Char assistant, Nell Range on: 01/07/2019 at 1:30PM.   PFO CLOSURE INSTRUCTIONS: You are scheduled for a PFO CLOSURE on Thursday, February 20 with Dr. Sherren Mocha.  1. Please arrive at the [facility] (Main Entrance A) at [facility]: [address] [place], [place] [address] at 10:00AM (This time is two hours before your procedure to ensure your preparation). Free valet parking service is available.   Special note: Every effort is made to have your procedure done on time. Please understand that emergencies sometimes delay scheduled procedures.  2. Diet: Do not eat solid foods after midnight.  The patient may have clear liquids until 5am upon the day of the procedure. Make sure to stay well hydrated the day before your procedure!  3. Labs: Today!  4. Medication instructions in preparation for your procedure:  1) Make sure to take ASPIRIN and PLAVIX the morning of your procedure  2) You may take other medications as directed with sips of water  5. Plan for one night stay--bring personal belongings. 6. Bring a current list of your medications and current insurance cards. 7. You MUST have a responsible person to drive you home. 8. Someone MUST be with you the first 24 hours after you arrive home or your discharge will be delayed. 9. Please wear clothes that are easy to get on and off and wear slip-on shoes.  Thank you for allowing [place] to care for you!   -- [place] [facility] Cardi[facility] services
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
## 2018-11-15 LAB — CBC WITH DIFFERENTIAL/PLATELET
Basophils Absolute: 0 10*3/uL (ref 0.0–0.2)
Basos: 1 %
EOS (ABSOLUTE): 0.2 10*3/uL (ref 0.0–0.4)
Eos: 2 %
Hematocrit: 40.5 % (ref 34.0–46.6)
Hemoglobin: 13.7 g/dL (ref 11.1–15.9)
Immature Grans (Abs): 0 10*3/uL (ref 0.0–0.1)
Immature Granulocytes: 0 %
Lymphocytes Absolute: 1.7 10*3/uL (ref 0.7–3.1)
Lymphs: 24 %
MCH: 30.8 pg (ref 26.6–33.0)
MCHC: 33.8 g/dL (ref 31.5–35.7)
MCV: 91 fL (ref 79–97)
MONOCYTES: 9 %
Monocytes Absolute: 0.7 10*3/uL (ref 0.1–0.9)
Neutrophils Absolute: 4.7 10*3/uL (ref 1.4–7.0)
Neutrophils: 64 %
Platelets: 256 10*3/uL (ref 150–450)
RBC: 4.45 x10E6/uL (ref 3.77–5.28)
RDW: 12.1 % (ref 11.7–15.4)
WBC: 7.4 10*3/uL (ref 3.4–10.8)

## 2018-11-15 LAB — BASIC METABOLIC PANEL
BUN/Creatinine Ratio: 16 (ref 9–23)
BUN: 13 mg/dL (ref 6–24)
CO2: 21 mmol/L (ref 20–29)
Calcium: 9.8 mg/dL (ref 8.7–10.2)
Chloride: 104 mmol/L (ref 96–106)
Creatinine, Ser: 0.79 mg/dL (ref 0.57–1.00)
GFR calc Af Amer: 102 mL/min/{1.73_m2} (ref >59)
GFR calc non Af Amer: 88 mL/min/{1.73_m2} (ref >59)
Glucose: 86 mg/dL (ref 65–99)
Potassium: 5 mmol/L (ref 3.5–5.2)
Sodium: 141 mmol/L (ref 134–144)

## 2018-11-18 ENCOUNTER — Telehealth: Payer: Self-pay

## 2018-11-18 NOTE — Telephone Encounter (Signed)
PA done on cover my meds pending. 

## 2018-11-19 NOTE — Telephone Encounter (Signed)
Approvedon February 4  Request Reference Number: [id number]. TROKENDI XR CAP 50MG  is approved through 11/19/2019. For further questions, call [phone].

## 2018-11-21 ENCOUNTER — Telehealth: Payer: Self-pay

## 2018-11-21 NOTE — Telephone Encounter (Signed)
Called patient to inform her that her PFO closure has been rescheduled to 2/20 first case. She will need to arrive at 0530.  Instructed her to call to discuss schedule changes.

## 2018-11-21 NOTE — Telephone Encounter (Signed)
Called patient per MyChart message.  Confirmed with her she will need to arrive for her PFO closure to [facility] at 0530. She was grateful for assistance.

## 2018-11-25 ENCOUNTER — Telehealth: Payer: Self-pay

## 2018-11-25 ENCOUNTER — Other Ambulatory Visit: Payer: Self-pay

## 2018-11-25 MED ORDER — ERENUMAB-AOOE 70 MG/ML ~~LOC~~ SOAJ: 70.0000 mg | SUBCUTANEOUS | 3 refills | Status: DC

## 2018-11-25 NOTE — Telephone Encounter (Signed)
New order in for aimovig

## 2018-11-26 ENCOUNTER — Telehealth: Payer: Self-pay | Admitting: *Deleted

## 2018-11-26 NOTE — Telephone Encounter (Signed)
Aimovig PA done on cover my meds. Its pending for 36 to 72 hours of approval or denial.

## 2018-11-26 NOTE — Telephone Encounter (Signed)
Per patient she is having and  experience confusion, dizziness, I'm unable to focus and have trouble concentrating, fatigue, drowsy, and memory problems with the trokendi and. DR Leonie Man is aware and told pt to stop taking it.

## 2018-11-26 NOTE — Addendum Note (Signed)
Addended by: Marval Regal on: 11/26/2018 01:44 PM   Modules accepted: Orders

## 2018-11-26 NOTE — Telephone Encounter (Signed)
Attempted PA for Aimovig 70mg  SQ q30 days on CoverMyMeds and with [employer] of [place], phone# [phone], but am being told by both that they do not recognize Dr. Leonie Man by name or NPI. I have asked Angie in billing to check on this.  When a PA is able to be completed, it is for dx. of Chronic Migraine, G43.709. Dx: Chronic Migraine (G43.709). Tried/failed meds: ASA, Excedrin Migraine, Atenolol, Topamax, Trokendi./fim

## 2018-11-27 ENCOUNTER — Encounter: Payer: Self-pay | Admitting: *Deleted

## 2018-11-27 DIAGNOSIS — R519 Headache, unspecified: Secondary | ICD-10-CM | POA: Insufficient documentation

## 2018-11-27 DIAGNOSIS — R51 Headache: Secondary | ICD-10-CM

## 2018-11-27 DIAGNOSIS — G8929 Other chronic pain: Secondary | ICD-10-CM

## 2018-11-27 HISTORY — DX: Other chronic pain: G89.29

## 2018-11-27 NOTE — Telephone Encounter (Signed)
PA submitted again for Aimovig injection.

## 2018-11-28 NOTE — Telephone Encounter (Signed)
PA denied. Pt has failed tried atenolol, and trokendi that was done on both PA. Pt also has a diagnosis of chronic headache. Pt will be sent co pay card to activate. Pt has Pharmacist, community.

## 2018-12-02 ENCOUNTER — Telehealth: Payer: Self-pay | Admitting: *Deleted

## 2018-12-02 DIAGNOSIS — Z0000 Encounter for general adult medical examination without abnormal findings: Secondary | ICD-10-CM | POA: Diagnosis not present

## 2018-12-02 NOTE — Progress Notes (Signed)
Patient not seen. Arranged to see Dr. Harrington Challenger on Friday.

## 2018-12-02 NOTE — Telephone Encounter (Addendum)
Pt contacted pre-PFO closure scheduled at [facility] for: Thursday December 04, 2018 7:30 AM Verified arrival time and place: [place] Entrance A at: 5:30 AM  No solid food after midnight prior to cath, clear liquids until 5 AM day of procedure.  AM meds can be  taken pre-cath with sip of water including: ASA 81 mg Plavix 75 mg Pt has not started Plavix yet as instructed-per Dr Romie Levee to take Plavix 150 mg (2 tablets) today and tomorrow, then 75 mg daily-pt verbalized understanding.   Confirmed patient has responsible person to drive home post procedure and observe 24 hours after arriving home: yes

## 2018-12-04 ENCOUNTER — Encounter (HOSPITAL_COMMUNITY): Payer: Self-pay | Admitting: Cardiovascular Disease

## 2018-12-04 ENCOUNTER — Other Ambulatory Visit: Payer: Self-pay

## 2018-12-04 ENCOUNTER — Encounter (HOSPITAL_COMMUNITY)
Admission: RE | Disposition: A | Discharge status: Home / Self Care | Payer: Self-pay | Source: Home / Self Care | Attending: Cardiovascular Disease

## 2018-12-04 ENCOUNTER — Ambulatory Visit (HOSPITAL_COMMUNITY)
Admission: RE | Admit: 2018-12-04 | Discharge: 2018-12-04 | Disposition: A | Discharge status: Home / Self Care | Payer: BLUE CROSS/BLUE SHIELD | Attending: Cardiovascular Disease | Admitting: Cardiovascular Disease

## 2018-12-04 ENCOUNTER — Ambulatory Visit (HOSPITAL_BASED_OUTPATIENT_CLINIC_OR_DEPARTMENT_OTHER): Payer: BLUE CROSS/BLUE SHIELD

## 2018-12-04 DIAGNOSIS — Z79899 Other long term (current) drug therapy: Secondary | ICD-10-CM | POA: Insufficient documentation

## 2018-12-04 DIAGNOSIS — Z87891 Personal history of nicotine dependence: Secondary | ICD-10-CM | POA: Diagnosis not present

## 2018-12-04 DIAGNOSIS — I493 Ventricular premature depolarization: Secondary | ICD-10-CM | POA: Diagnosis not present

## 2018-12-04 DIAGNOSIS — Z8249 Family history of ischemic heart disease and other diseases of the circulatory system: Secondary | ICD-10-CM | POA: Insufficient documentation

## 2018-12-04 DIAGNOSIS — Z881 Allergy status to other antibiotic agents status: Secondary | ICD-10-CM | POA: Diagnosis not present

## 2018-12-04 DIAGNOSIS — Q211 Atrial septal defect: Secondary | ICD-10-CM | POA: Insufficient documentation

## 2018-12-04 DIAGNOSIS — Z7982 Long term (current) use of aspirin: Secondary | ICD-10-CM | POA: Diagnosis not present

## 2018-12-04 DIAGNOSIS — Z880 Allergy status to penicillin: Secondary | ICD-10-CM | POA: Diagnosis not present

## 2018-12-04 DIAGNOSIS — Z7902 Long term (current) use of antithrombotics/antiplatelets: Secondary | ICD-10-CM | POA: Insufficient documentation

## 2018-12-04 DIAGNOSIS — I4891 Unspecified atrial fibrillation: Secondary | ICD-10-CM | POA: Insufficient documentation

## 2018-12-04 DIAGNOSIS — Z9851 Tubal ligation status: Secondary | ICD-10-CM | POA: Diagnosis not present

## 2018-12-04 DIAGNOSIS — Q2112 Patent foramen ovale: Secondary | ICD-10-CM

## 2018-12-04 HISTORY — PX: PATENT FORAMEN OVALE(PFO) CLOSURE: CATH118300

## 2018-12-04 LAB — HEMOGLOBIN AND HEMATOCRIT, BLOOD
HCT: 46.9 % — ABNORMAL HIGH (ref 36.0–46.0)
Hemoglobin: 14.6 g/dL (ref 12.0–15.0)

## 2018-12-04 LAB — ECHOCARDIOGRAM LIMITED
Height: 66 in
WEIGHTICAEL: 3360 [oz_av]

## 2018-12-04 LAB — POCT ACTIVATED CLOTTING TIME
Activated Clotting Time: 213 seconds
Activated Clotting Time: 175 seconds

## 2018-12-04 SURGERY — PATENT FORAMEN OVALE (PFO) CLOSURE
Anesthesia: LOCAL

## 2018-12-04 MED ORDER — MIDAZOLAM HCL 2 MG/2ML IJ SOLN
INTRAMUSCULAR | Status: AC
  Filled 2018-12-04: qty 2

## 2018-12-04 MED ORDER — SODIUM CHLORIDE 0.9% FLUSH: 3.0000 mL | INTRAVENOUS | Status: DC | PRN

## 2018-12-04 MED ORDER — ACETAMINOPHEN 325 MG PO TABS: 650.0000 mg | ORAL_TABLET | ORAL | Status: DC | PRN

## 2018-12-04 MED ORDER — OXYCODONE-ACETAMINOPHEN 5-325 MG PO TABS
1.0000 | ORAL_TABLET | Freq: Once | ORAL | Status: AC
  Administered 2018-12-04: 1 via ORAL
  Filled 2018-12-04: qty 1

## 2018-12-04 MED ORDER — SODIUM CHLORIDE 0.9 % WEIGHT BASED INFUSION
3.0000 mL/kg/h | INTRAVENOUS | Status: AC
  Administered 2018-12-04: 3 mL/kg/h via INTRAVENOUS

## 2018-12-04 MED ORDER — VANCOMYCIN HCL IN DEXTROSE 1-5 GM/200ML-% IV SOLN
1000.0000 mg | INTRAVENOUS | Status: AC
  Administered 2018-12-04: 1000 mg via INTRAVENOUS

## 2018-12-04 MED ORDER — DIAZEPAM 5 MG PO TABS
5.0000 mg | ORAL_TABLET | Freq: Once | ORAL | Status: AC
  Administered 2018-12-04: 5 mg via ORAL
  Filled 2018-12-04: qty 1

## 2018-12-04 MED ORDER — ONDANSETRON HCL 4 MG/2ML IJ SOLN: 4.0000 mg | Freq: Four times a day (QID) | INTRAMUSCULAR | Status: DC | PRN

## 2018-12-04 MED ORDER — HEPARIN (PORCINE) IN NACL 1000-0.9 UT/500ML-% IV SOLN
INTRAVENOUS | Status: DC | PRN
  Administered 2018-12-04 (×3): 500 mL

## 2018-12-04 MED ORDER — LIDOCAINE HCL (PF) 1 % IJ SOLN
INTRAMUSCULAR | Status: AC
  Filled 2018-12-04: qty 30

## 2018-12-04 MED ORDER — OXYCODONE HCL 5 MG PO TABS
5.0000 mg | ORAL_TABLET | ORAL | Status: DC | PRN
  Administered 2018-12-04: 10 mg via ORAL
  Filled 2018-12-04: qty 2

## 2018-12-04 MED ORDER — HEPARIN (PORCINE) IN NACL 1000-0.9 UT/500ML-% IV SOLN
INTRAVENOUS | Status: AC
  Filled 2018-12-04: qty 500

## 2018-12-04 MED ORDER — HEPARIN SODIUM (PORCINE) 1000 UNIT/ML IJ SOLN
INTRAMUSCULAR | Status: DC | PRN
  Administered 2018-12-04: 7000 [IU] via INTRAVENOUS

## 2018-12-04 MED ORDER — SODIUM CHLORIDE 0.9 % IV SOLN
INTRAVENOUS | Status: AC | PRN
  Administered 2018-12-04: 10 mL/h via INTRAVENOUS

## 2018-12-04 MED ORDER — CLOPIDOGREL BISULFATE 75 MG PO TABS: 75.0000 mg | ORAL_TABLET | ORAL | Status: DC

## 2018-12-04 MED ORDER — SODIUM CHLORIDE 0.9 % WEIGHT BASED INFUSION: 1.0000 mL/kg/h | INTRAVENOUS | Status: DC

## 2018-12-04 MED ORDER — MIDAZOLAM HCL 2 MG/2ML IJ SOLN
INTRAMUSCULAR | Status: DC | PRN
  Administered 2018-12-04: 1 mg via INTRAVENOUS
  Administered 2018-12-04: 2 mg via INTRAVENOUS

## 2018-12-04 MED ORDER — ASPIRIN 81 MG PO CHEW
81.0000 mg | CHEWABLE_TABLET | ORAL | Status: AC
  Administered 2018-12-04: 81 mg via ORAL
  Filled 2018-12-04: qty 1

## 2018-12-04 MED ORDER — SODIUM CHLORIDE 0.9% FLUSH: 3.0000 mL | Freq: Two times a day (BID) | INTRAVENOUS | Status: DC

## 2018-12-04 MED ORDER — SODIUM CHLORIDE 0.9 % IV SOLN: 250.0000 mL | INTRAVENOUS | Status: DC | PRN

## 2018-12-04 MED ORDER — VANCOMYCIN HCL IN DEXTROSE 1-5 GM/200ML-% IV SOLN
INTRAVENOUS | Status: AC
  Filled 2018-12-04: qty 200

## 2018-12-04 MED ORDER — FENTANYL CITRATE (PF) 100 MCG/2ML IJ SOLN
INTRAMUSCULAR | Status: DC | PRN
  Administered 2018-12-04 (×2): 25 ug via INTRAVENOUS

## 2018-12-04 MED ORDER — LIDOCAINE HCL (PF) 1 % IJ SOLN
INTRAMUSCULAR | Status: DC | PRN
  Administered 2018-12-04: 20 mL

## 2018-12-04 MED ORDER — FENTANYL CITRATE (PF) 100 MCG/2ML IJ SOLN
INTRAMUSCULAR | Status: AC
  Filled 2018-12-04: qty 2

## 2018-12-04 MED ORDER — DIAZEPAM 5 MG PO TABS
5.0000 mg | ORAL_TABLET | Freq: Four times a day (QID) | ORAL | Status: DC | PRN
  Administered 2018-12-04: 5 mg via ORAL
  Filled 2018-12-04: qty 1

## 2018-12-04 MED ORDER — DIAZEPAM 5 MG PO TABS: 5.0000 mg | ORAL_TABLET | Freq: Once | ORAL | Status: DC

## 2018-12-04 SURGICAL SUPPLY — 15 items
CATH ACUNAV 8FR 90CM (CATHETERS) ×1 IMPLANT
CATH INFINITI 5 FR MPA2 (CATHETERS) ×1 IMPLANT
COVER SWIFTLINK CONNECTOR (BAG) ×1 IMPLANT
GUIDEWIRE AMPLATZER 1.5JX260 (WIRE) ×1 IMPLANT
OCCLUDER AMPLATZER PFO 18MM (Prosthesis & Implant Heart) ×1 IMPLANT
PACK CARDIAC CATHETERIZATION (CUSTOM PROCEDURE TRAY) ×2 IMPLANT
PROTECTION STATION PRESSURIZED (MISCELLANEOUS) ×2
SHEATH INTROD W/O MIN 9FR 25CM (SHEATH) ×1 IMPLANT
SHEATH PINNACLE 8F 10CM (SHEATH) ×1 IMPLANT
SHEATH PROBE COVER 6X72 (BAG) ×1 IMPLANT
STATION PROTECTION PRESSURIZED (MISCELLANEOUS) IMPLANT
SYS DELIVER AMP TREVISIO 8FR (SHEATH) ×2
SYSTEM DELIVER AMP TREVIS 8FR (SHEATH) IMPLANT
WIRE AQUATRAK .035X260 ANG (WIRE) ×1 IMPLANT
WIRE EMERALD 3MM-J .035X150CM (WIRE) ×1 IMPLANT

## 2018-12-04 NOTE — Progress Notes (Signed)
Dr Burt Knack notified of stat Hgb and okay to d/c home

## 2018-12-04 NOTE — Interval H&P Note (Signed)
History and Physical Interval Note:  12/04/2018 6:05 AM  Savannah Dennis  has presented today for surgery, with the diagnosis of PFO  The various methods of treatment have been discussed with the patient and family. After consideration of risks, benefits and other options for treatment, the patient has consented to  Procedure(s): PATENT FORAMEN OVALE (PFO) CLOSURE (N/A) as a surgical intervention .  The patient's history has been reviewed, patient examined, no change in status, stable for surgery.  I have reviewed the patient's chart and labs.  Questions were answered to the patient's satisfaction.     Sherren Mocha

## 2018-12-04 NOTE — Discharge Instructions (Signed)
You will require antibiotics prior to any dental work, including cleanings, for 6 months after your PFO/ASD closure. This is to protect the device from potentially getting infected from bacteria in your mouth entering your bloodstream. The medication has been called into your pharmacy on file. Please pick this up to have ready before any scheduled dental work. Instructions will be outlined on the bottle. The medication should be taken 1 hour prior to your dental appointment. ° ° °Groin Site Care °Refer to this sheet in the next few weeks. These instructions provide you with information on caring for yourself after your procedure. Your caregiver may also give you more specific instructions. Your treatment has been planned according to current medical practices, but problems sometimes occur. Call your caregiver if you have any problems or questions after your procedure. °HOME CARE INSTRUCTIONS °· You may shower 24 hours after the procedure. Remove the bandage (dressing) and gently wash the site with plain soap and water. Gently pat the site dry.  °· Do not apply powder or lotion to the site.  °· Do not sit in a bathtub, swimming pool, or whirlpool for 5 to 7 days.  °· No bending, squatting, or lifting anything over 10 pounds (4.5 kg) for 1 week °· Inspect the site at least twice daily.  °· Do not drive home if you are discharged the same day of the procedure. Have someone else drive you.  °· You may drive 24 hours after the procedure unless otherwise instructed by your caregiver.  °What to expect: °· Any bruising will usually fade within 1 to 2 weeks.  °· Blood that collects in the tissue (hematoma) may be painful to the touch. It should usually decrease in size and tenderness within 1 to 2 weeks.  °SEEK IMMEDIATE MEDICAL CARE IF: °· You have unusual pain at the groin site or down the affected leg.  °· You have redness, warmth, swelling, or pain at the groin site.  °· You have drainage (other than a small amount of  blood on the dressing).  °· You have chills.  °· You have a fever or persistent symptoms for more than 72 hours.  °· You have a fever and your symptoms suddenly get worse.  °· Your leg becomes pale, cool, tingly, or numb.  °You have heavy bleeding from the site. Hold pressure on the site. ° °

## 2018-12-04 NOTE — Progress Notes (Signed)
Site area: Right groin a 8 and 9 french long sheath was removed  Site Prior to Removal:  Level 0  Pressure Applied For 20 MINUTES    Bedrest Beginning at 0930am  Manual:   Yes.    Patient Status During Pull:  stable  Post Pull Groin Site:  Level 0  Post Pull Instructions Given:  Yes.    Post Pull Pulses Present:  Yes.    Dressing Applied:  Yes.    Comments:  VS remain stable

## 2018-12-04 NOTE — Progress Notes (Signed)
Patient examined in the short stay. Right groin site is stable.  She continues to have a headache which she came in with today.  She has chronic migraines.  No other complaints.  Echocardiogram is reviewed and shows normal device position and no evidence of pericardial effusion.  She is stable for discharge home today.  Sherren Mocha 12/04/2018 1:57 PM

## 2018-12-04 NOTE — Progress Notes (Signed)
2D Echocardiogram has been performed.  Matilde Bash 12/04/2018, 12:24 PM

## 2018-12-04 NOTE — Progress Notes (Signed)
Client states has had black stools for a couple of days; Dr Burt Knack notified and order noted and lab sent

## 2018-12-04 NOTE — Progress Notes (Signed)
Up and walked and tolerated well; Dr Burt Knack in and okay to d/c home; client cont c/o headache 7/10 and no new orders and ok to d/c home

## 2018-12-07 ENCOUNTER — Telehealth: Payer: Self-pay | Admitting: Cardiology

## 2018-12-07 NOTE — Telephone Encounter (Signed)
Pt called- rash (? delayed contrast reaction) on arms and hands s/p PFO closure.  No new medications.  I suggested benadryl 25 mg Q 6 PRN but to call back if this is not helping.   Kerin Ransom PA-C 12/07/2018 11:23 AM

## 2018-12-08 ENCOUNTER — Ambulatory Visit: Payer: BLUE CROSS/BLUE SHIELD | Admitting: Cardiovascular Disease

## 2018-12-08 ENCOUNTER — Encounter: Payer: Self-pay | Admitting: Cardiovascular Disease

## 2018-12-08 VITALS — BP 102/80 | HR 76 | Ht 66.0 in | Wt 217.4 lb

## 2018-12-08 DIAGNOSIS — L270 Generalized skin eruption due to drugs and medicaments taken internally: Secondary | ICD-10-CM

## 2018-12-08 DIAGNOSIS — Q211 Atrial septal defect: Secondary | ICD-10-CM | POA: Diagnosis not present

## 2018-12-08 DIAGNOSIS — Q2112 Patent foramen ovale: Secondary | ICD-10-CM

## 2018-12-08 MED ORDER — TICAGRELOR 90 MG PO TABS: 90.0000 mg | ORAL_TABLET | Freq: Two times a day (BID) | ORAL | 11 refills | Status: DC
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
## 2018-12-08 NOTE — Telephone Encounter (Signed)
Per Dr. Burt Knack, added Ms. Rake to his schedule this AM for evaluation. Instructed her not to take her Plavix this morning as well. She was grateful for call and agrees with treatment plan.

## 2018-12-08 NOTE — Patient Instructions (Signed)
Medication Instructions:  1) Do not take any more Plavix. 2) START BRILINTA 90 mg twice daily on Wednesday  Labwork: None  Testing/Procedures: None  Follow-Up: Please keep your upcoming appointment with Nell Range, PA.

## 2018-12-09 DIAGNOSIS — G459 Transient cerebral ischemic attack, unspecified: Secondary | ICD-10-CM | POA: Diagnosis not present

## 2018-12-09 DIAGNOSIS — Z0000 Encounter for general adult medical examination without abnormal findings: Secondary | ICD-10-CM | POA: Diagnosis not present

## 2018-12-09 DIAGNOSIS — R5383 Other fatigue: Secondary | ICD-10-CM | POA: Diagnosis not present

## 2018-12-09 DIAGNOSIS — R001 Bradycardia, unspecified: Secondary | ICD-10-CM | POA: Diagnosis not present

## 2018-12-25 NOTE — Telephone Encounter (Signed)
Reviewed with Nell Range, PA prior to sending message.  Med list updated.

## 2019-01-06 ENCOUNTER — Telehealth (INDEPENDENT_AMBULATORY_CARE_PROVIDER_SITE_OTHER): Payer: BLUE CROSS/BLUE SHIELD | Admitting: Physician Assistant

## 2019-01-06 ENCOUNTER — Other Ambulatory Visit: Payer: Self-pay

## 2019-01-06 ENCOUNTER — Other Ambulatory Visit: Payer: Self-pay | Admitting: Physician Assistant

## 2019-01-06 DIAGNOSIS — Z8774 Personal history of (corrected) congenital malformations of heart and circulatory system: Secondary | ICD-10-CM

## 2019-01-06 HISTORY — DX: Personal history of (corrected) congenital malformations of heart and circulatory system: Z87.74
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
## 2019-01-07 ENCOUNTER — Ambulatory Visit: Payer: BLUE CROSS/BLUE SHIELD | Admitting: Physician Assistant

## 2019-02-10 ENCOUNTER — Telehealth: Payer: Self-pay

## 2019-02-10 NOTE — Telephone Encounter (Signed)
I called pt that her appt with Dr. Tilden Dome on Monday will be cancel because she is seeing Dr. Jaynee Eagles for headaches. Pt stated her headaches are gone since the PFO closure. SHe is not taking the trokendi or aimovig. I explain to call a week prior if she is still headache free. If she is feeling good with no headaches she can cancel with Dr.Ahern. I stated if headaches are coming back keep the appt with Dr. Jaynee Eagles. Pt verbalized understanding.

## 2019-02-16 ENCOUNTER — Ambulatory Visit: Payer: BLUE CROSS/BLUE SHIELD | Admitting: Neurology

## 2019-04-06 ENCOUNTER — Institutional Professional Consult (permissible substitution): Payer: Self-pay | Admitting: Neurology

## 2019-07-08 ENCOUNTER — Encounter: Payer: Self-pay | Admitting: Gynecology

## 2019-09-02 DIAGNOSIS — J3489 Other specified disorders of nose and nasal sinuses: Secondary | ICD-10-CM | POA: Diagnosis not present

## 2019-09-02 DIAGNOSIS — Z20828 Contact with and (suspected) exposure to other viral communicable diseases: Secondary | ICD-10-CM | POA: Diagnosis not present

## 2019-09-22 DIAGNOSIS — N6320 Unspecified lump in the left breast, unspecified quadrant: Secondary | ICD-10-CM | POA: Diagnosis not present

## 2020-01-12 ENCOUNTER — Other Ambulatory Visit: Payer: Self-pay | Admitting: Physician Assistant

## 2020-01-12 DIAGNOSIS — Z8774 Personal history of (corrected) congenital malformations of heart and circulatory system: Secondary | ICD-10-CM
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
## 2020-01-27 ENCOUNTER — Other Ambulatory Visit (HOSPITAL_COMMUNITY): Payer: BLUE CROSS/BLUE SHIELD

## 2020-01-27 ENCOUNTER — Ambulatory Visit: Payer: BLUE CROSS/BLUE SHIELD | Admitting: Physician Assistant

## 2020-01-28 ENCOUNTER — Other Ambulatory Visit: Payer: Self-pay

## 2020-01-28 ENCOUNTER — Encounter: Payer: Self-pay | Admitting: Physician Assistant

## 2020-01-28 ENCOUNTER — Ambulatory Visit (HOSPITAL_COMMUNITY): Payer: BC Managed Care – PPO | Attending: Internal Medicine

## 2020-01-28 ENCOUNTER — Ambulatory Visit: Payer: BC Managed Care – PPO | Admitting: Physician Assistant

## 2020-01-28 ENCOUNTER — Other Ambulatory Visit (HOSPITAL_COMMUNITY): Payer: BLUE CROSS/BLUE SHIELD

## 2020-01-28 VITALS — BP 150/90 | HR 77 | Ht 66.0 in | Wt 214.2 lb

## 2020-01-28 DIAGNOSIS — Z8774 Personal history of (corrected) congenital malformations of heart and circulatory system: Secondary | ICD-10-CM | POA: Diagnosis not present

## 2020-01-28 MED ORDER — AMLODIPINE BESYLATE 5 MG PO TABS: 5.0000 mg | ORAL_TABLET | Freq: Every day | ORAL | 3 refills | Status: DC

## 2020-01-28 NOTE — Patient Instructions (Signed)
Medication Instructions:  Your physician has recommended you make the following change in your medication:  1.) amlodipine 5 mg - take one tablet by mouth daily (blood pressure)  *If you need a refill on your cardiac medications before your next appointment, please call your pharmacy*  Lab Work: None  Testing/Procedures: none   Other Instructions Keep track of BP at home and send in readings in 2-3 weeks to Nell Range, PA-C to review.

## 2020-02-01 DIAGNOSIS — F4321 Adjustment disorder with depressed mood: Secondary | ICD-10-CM | POA: Diagnosis not present

## 2020-02-01 DIAGNOSIS — F4323 Adjustment disorder with mixed anxiety and depressed mood: Secondary | ICD-10-CM | POA: Diagnosis not present

## 2020-02-01 DIAGNOSIS — Z1331 Encounter for screening for depression: Secondary | ICD-10-CM | POA: Diagnosis not present

## 2020-02-01 DIAGNOSIS — F5105 Insomnia due to other mental disorder: Secondary | ICD-10-CM | POA: Diagnosis not present

## 2020-02-15 ENCOUNTER — Other Ambulatory Visit: Payer: Self-pay | Admitting: Physician Assistant

## 2020-02-15 MED ORDER — AMLODIPINE BESYLATE 10 MG PO TABS: 10.0000 mg | ORAL_TABLET | Freq: Every day | ORAL | 3 refills | Status: DC

## 2020-02-22 DIAGNOSIS — F322 Major depressive disorder, single episode, severe without psychotic features: Secondary | ICD-10-CM | POA: Diagnosis not present

## 2020-02-22 DIAGNOSIS — F418 Other specified anxiety disorders: Secondary | ICD-10-CM | POA: Diagnosis not present

## 2020-02-22 DIAGNOSIS — F5102 Adjustment insomnia: Secondary | ICD-10-CM | POA: Diagnosis not present

## 2020-02-22 DIAGNOSIS — Z6835 Body mass index (BMI) 35.0-35.9, adult: Secondary | ICD-10-CM | POA: Diagnosis not present

## 2020-03-10 DIAGNOSIS — F418 Other specified anxiety disorders: Secondary | ICD-10-CM | POA: Diagnosis not present

## 2020-03-10 DIAGNOSIS — R609 Edema, unspecified: Secondary | ICD-10-CM | POA: Diagnosis not present

## 2020-03-10 DIAGNOSIS — F324 Major depressive disorder, single episode, in partial remission: Secondary | ICD-10-CM | POA: Diagnosis not present

## 2020-03-10 DIAGNOSIS — F5102 Adjustment insomnia: Secondary | ICD-10-CM | POA: Diagnosis not present

## 2020-03-13 IMAGING — CT CT HEART SCORING
2 series · 16 of 20 positions shown, 18 images · non-contrast
Comparison: None.

Addendum:
EXAM:
OVER-READ INTERPRETATION  CT CHEST

The following report is an over-read performed by radiologist Dr.
Mayumi Clemenson [REDACTED] on 09/23/2018. This
over-read does not include interpretation of cardiac or coronary
anatomy or pathology. The coronary calcium score interpretation by
the cardiologist is attached.
CLINICAL DATA: Risk stratification
Coronary Calcium Score
TECHNIQUE: The patient was scanned on a Siemens Somatom 64 slice scanner. Axial
non-contrast 3 mm slices were carried out through the heart. The
data set was analyzed on a dedicated work station and scored using
the Agatson method.

[Series 2: casc 3.0 i36f 2 bestdiast 69 % · axial · 0.34mm/px · z∈[-219,-114]mm · 8 of 47 slices shown, 10 images]
[im 6/47  vessel]
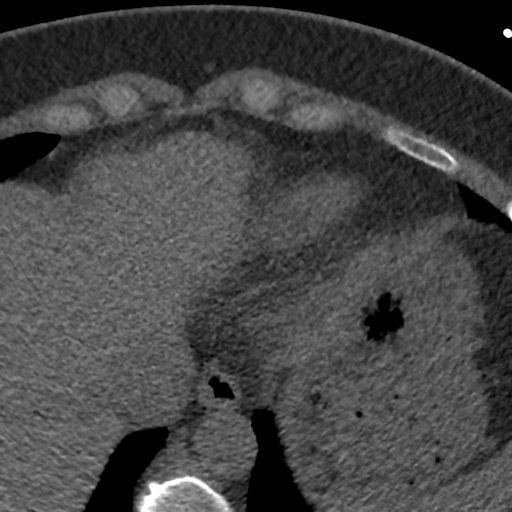
[im 6/47  lung]
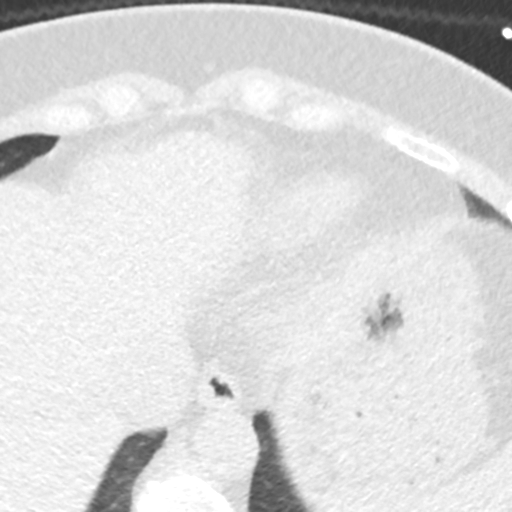
[im 11/47  vessel]
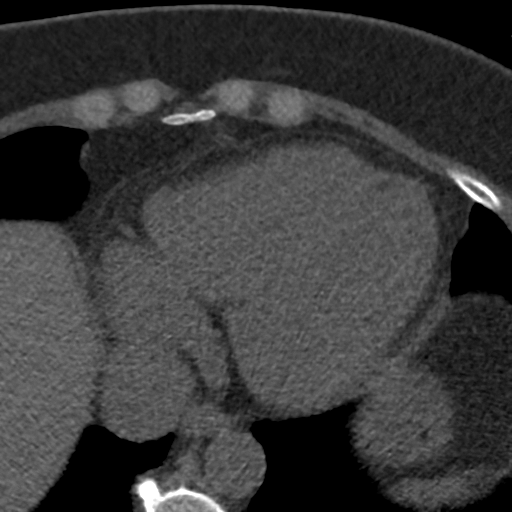
[im 16/47  vessel]
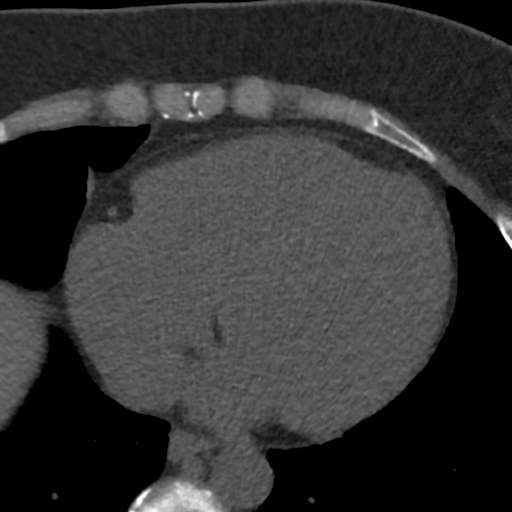
[im 21/47  vessel]
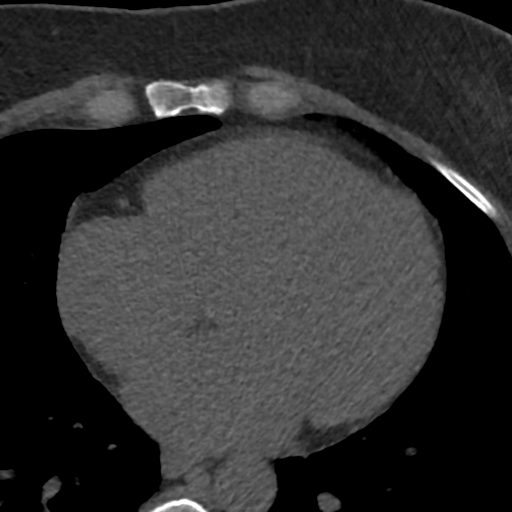
[im 26/47  vessel]
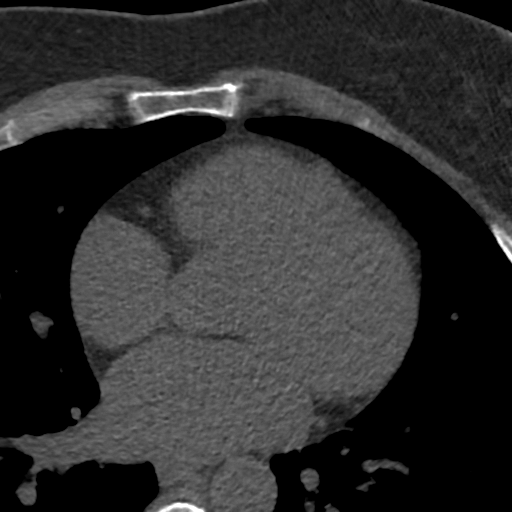
[im 26/47  lung]
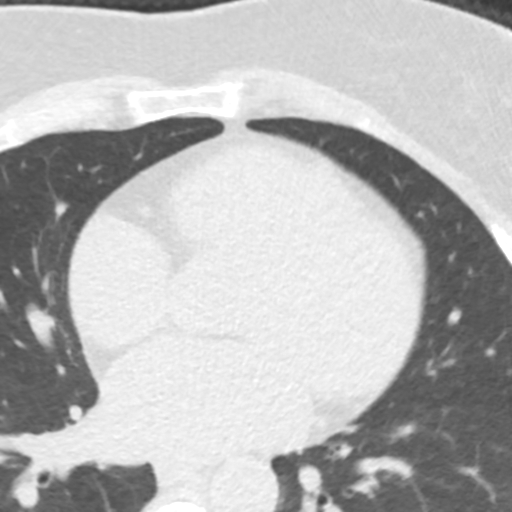
[im 31/47  vessel]
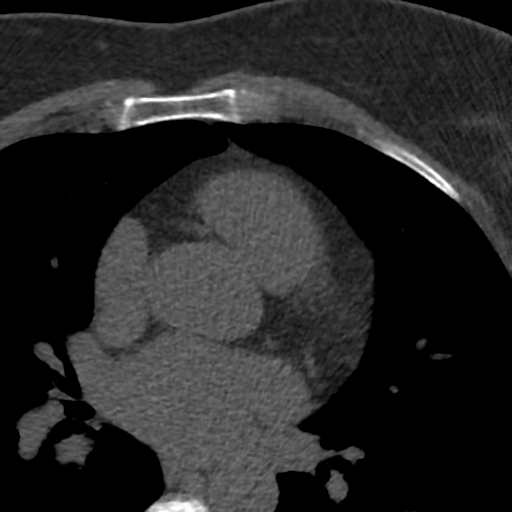
[im 36/47  vessel]
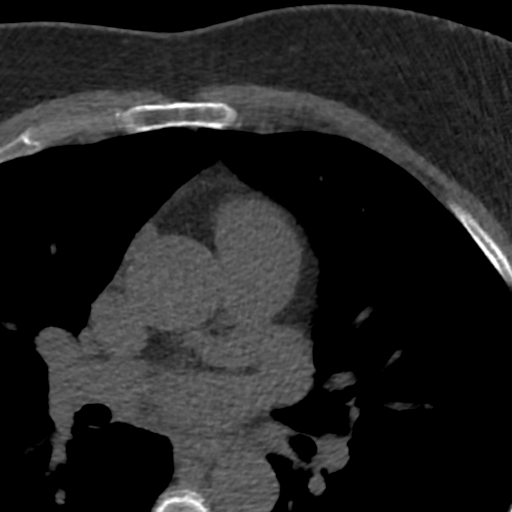
[im 41/47  vessel]
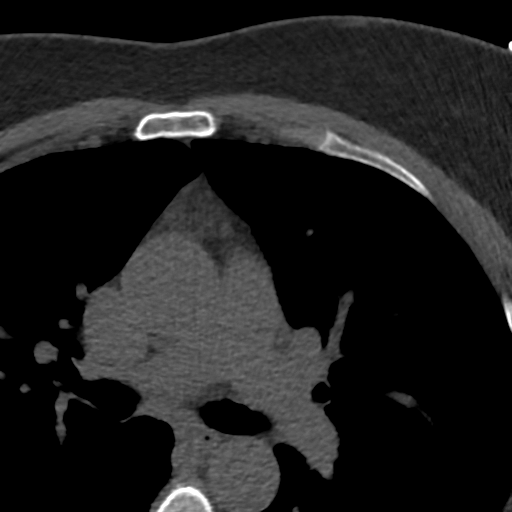

[Series 4: lung st 69 % · axial · 0.68mm/px · z∈[-219,-114]mm · 8 of 47 slices shown]
[im 6/47  lung]
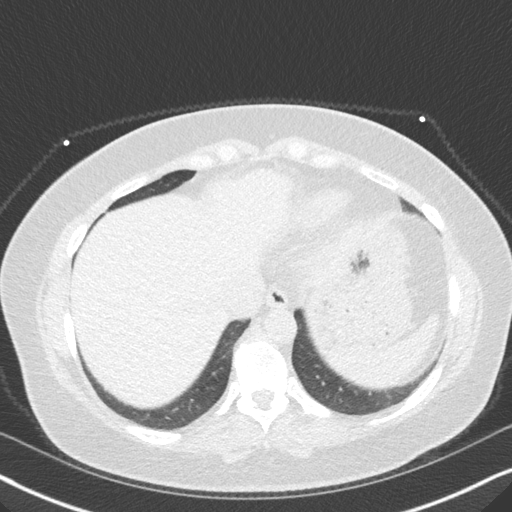
[im 11/47  lung]
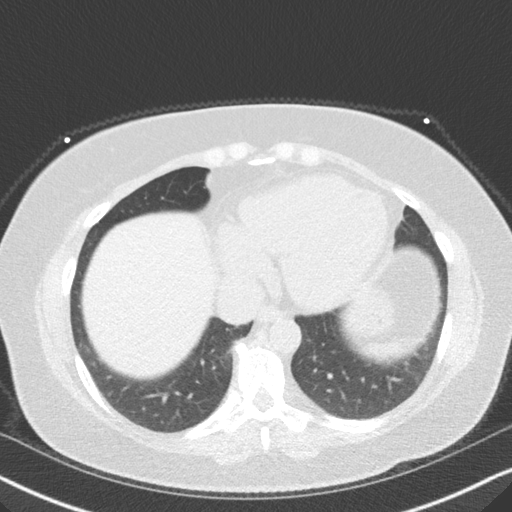
[im 16/47  lung]
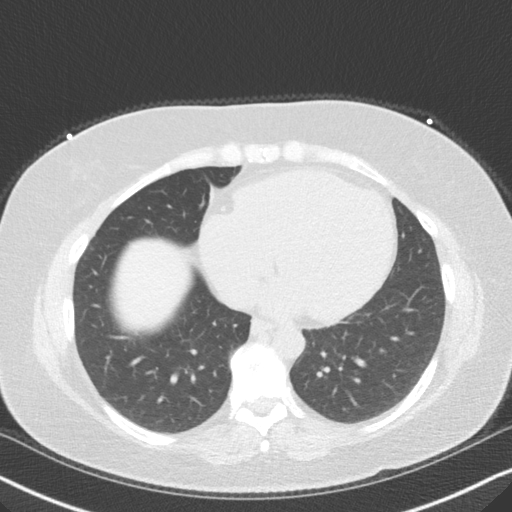
[im 21/47  lung]
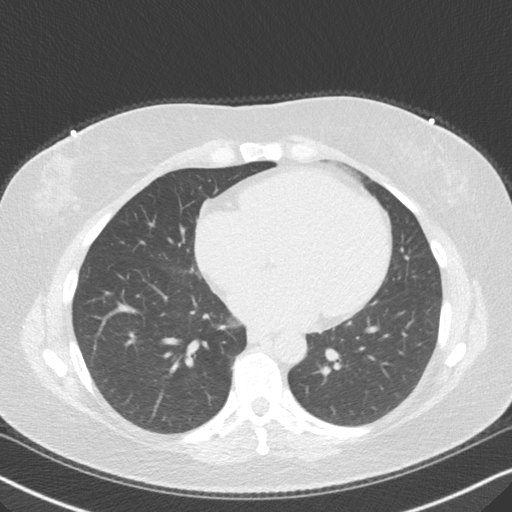
[im 26/47  lung]
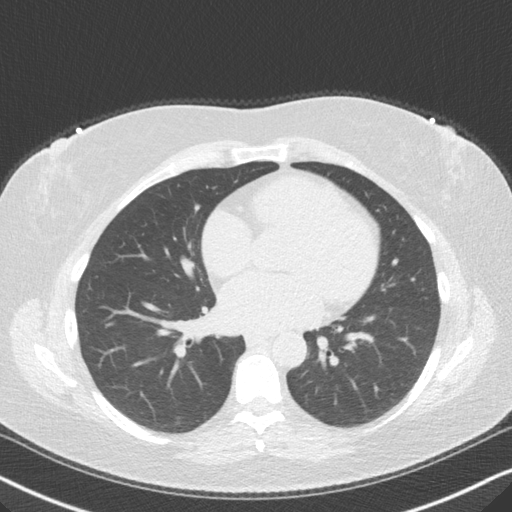
[im 31/47  lung]
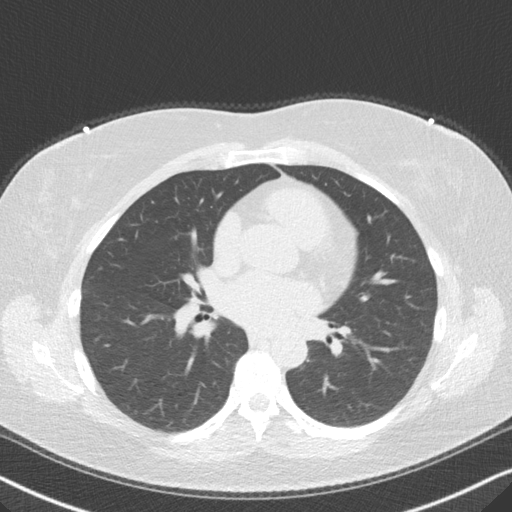
[im 36/47  lung]
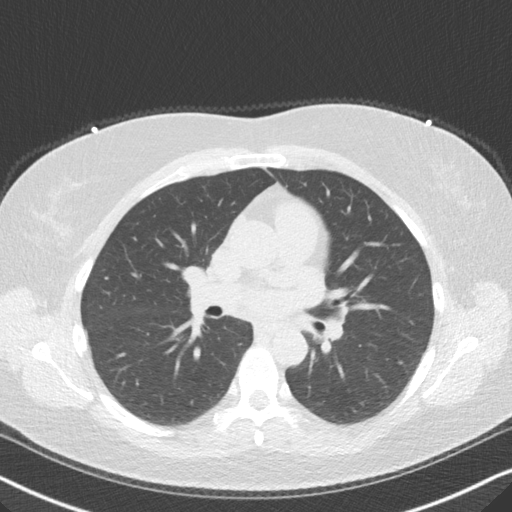
[im 41/47  lung]
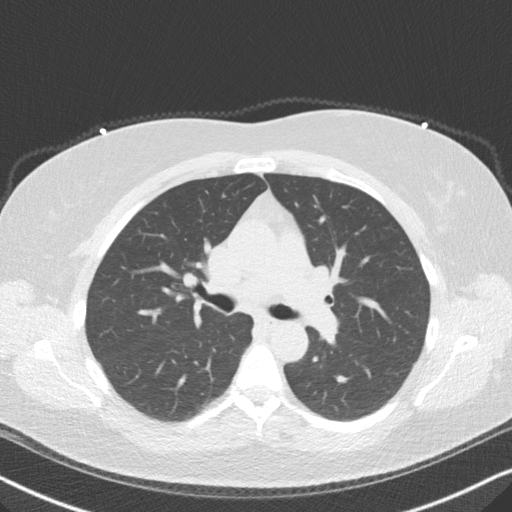

[16 of 20 positions shown; findings below may reference images not displayed]

FINDINGS: Vascular: Heart is normal size.  Visualized aorta is normal caliber.

Mediastinum/Nodes: No adenopathy in the lower mediastinum or hila.

Lungs/Pleura: Visualized lungs clear.  No effusions.

Upper Abdomen: Imaging into the upper abdomen shows no acute
findings.

Musculoskeletal: Chest wall soft tissues are unremarkable. No acute
bony abnormality.
IMPRESSION: No acute or significant extracardiac abnormality.
FINDINGS: Non-cardiac: See separate report from [REDACTED].

Ascending aorta: Normal diameter 3.2 cm

Pericardium: Normal

Coronary arteries: No calcium noted
IMPRESSION: Coronary calcium score of 0.

Shaan Largo

*** End of Addendum ***

## 2020-03-30 ENCOUNTER — Other Ambulatory Visit: Payer: Self-pay

## 2020-03-30 MED ORDER — AMLODIPINE BESYLATE 10 MG PO TABS: 10.0000 mg | ORAL_TABLET | Freq: Every day | ORAL | 2 refills | Status: DC

## 2020-04-07 DIAGNOSIS — F324 Major depressive disorder, single episode, in partial remission: Secondary | ICD-10-CM | POA: Diagnosis not present

## 2020-04-07 DIAGNOSIS — F5102 Adjustment insomnia: Secondary | ICD-10-CM | POA: Diagnosis not present

## 2020-04-07 DIAGNOSIS — Z6835 Body mass index (BMI) 35.0-35.9, adult: Secondary | ICD-10-CM | POA: Diagnosis not present

## 2020-04-07 DIAGNOSIS — R609 Edema, unspecified: Secondary | ICD-10-CM | POA: Diagnosis not present

## 2020-04-25 DIAGNOSIS — F324 Major depressive disorder, single episode, in partial remission: Secondary | ICD-10-CM | POA: Diagnosis not present

## 2020-04-25 DIAGNOSIS — I10 Essential (primary) hypertension: Secondary | ICD-10-CM | POA: Diagnosis not present

## 2020-04-25 DIAGNOSIS — Z6834 Body mass index (BMI) 34.0-34.9, adult: Secondary | ICD-10-CM | POA: Diagnosis not present

## 2020-04-25 DIAGNOSIS — Z20822 Contact with and (suspected) exposure to covid-19: Secondary | ICD-10-CM | POA: Diagnosis not present

## 2020-04-25 DIAGNOSIS — F418 Other specified anxiety disorders: Secondary | ICD-10-CM | POA: Diagnosis not present

## 2020-08-04 DIAGNOSIS — Z1151 Encounter for screening for human papillomavirus (HPV): Secondary | ICD-10-CM | POA: Diagnosis not present

## 2020-08-04 DIAGNOSIS — Z6834 Body mass index (BMI) 34.0-34.9, adult: Secondary | ICD-10-CM | POA: Diagnosis not present

## 2020-08-04 DIAGNOSIS — N841 Polyp of cervix uteri: Secondary | ICD-10-CM | POA: Diagnosis not present

## 2020-08-04 DIAGNOSIS — Z01419 Encounter for gynecological examination (general) (routine) without abnormal findings: Secondary | ICD-10-CM | POA: Diagnosis not present

## 2020-08-11 DIAGNOSIS — R9389 Abnormal findings on diagnostic imaging of other specified body structures: Secondary | ICD-10-CM | POA: Diagnosis not present

## 2020-09-02 DIAGNOSIS — N841 Polyp of cervix uteri: Secondary | ICD-10-CM | POA: Diagnosis not present

## 2020-10-15 HISTORY — PX: HYSTEROSCOPY: SHX211

## 2020-10-24 DIAGNOSIS — N840 Polyp of corpus uteri: Secondary | ICD-10-CM | POA: Diagnosis not present

## 2020-10-24 DIAGNOSIS — R319 Hematuria, unspecified: Secondary | ICD-10-CM | POA: Diagnosis not present

## 2020-11-01 DIAGNOSIS — U071 COVID-19: Secondary | ICD-10-CM | POA: Diagnosis not present

## 2020-11-04 DIAGNOSIS — J159 Unspecified bacterial pneumonia: Secondary | ICD-10-CM | POA: Diagnosis not present

## 2020-11-04 DIAGNOSIS — Z20822 Contact with and (suspected) exposure to covid-19: Secondary | ICD-10-CM | POA: Diagnosis not present

## 2020-12-07 DIAGNOSIS — S66911A Strain of unspecified muscle, fascia and tendon at wrist and hand level, right hand, initial encounter: Secondary | ICD-10-CM | POA: Diagnosis not present

## 2021-01-13 DIAGNOSIS — M25531 Pain in right wrist: Secondary | ICD-10-CM | POA: Diagnosis not present

## 2021-01-13 DIAGNOSIS — M25431 Effusion, right wrist: Secondary | ICD-10-CM | POA: Diagnosis not present

## 2021-01-16 DIAGNOSIS — S66911A Strain of unspecified muscle, fascia and tendon at wrist and hand level, right hand, initial encounter: Secondary | ICD-10-CM | POA: Diagnosis not present

## 2021-01-16 DIAGNOSIS — M7721 Periarthritis, right wrist: Secondary | ICD-10-CM | POA: Diagnosis not present

## 2021-02-27 DIAGNOSIS — F329 Major depressive disorder, single episode, unspecified: Secondary | ICD-10-CM | POA: Diagnosis not present

## 2021-02-27 DIAGNOSIS — F4310 Post-traumatic stress disorder, unspecified: Secondary | ICD-10-CM | POA: Diagnosis not present

## 2021-02-27 DIAGNOSIS — F411 Generalized anxiety disorder: Secondary | ICD-10-CM | POA: Diagnosis not present

## 2021-03-01 DIAGNOSIS — F329 Major depressive disorder, single episode, unspecified: Secondary | ICD-10-CM | POA: Diagnosis not present

## 2021-03-01 DIAGNOSIS — F411 Generalized anxiety disorder: Secondary | ICD-10-CM | POA: Diagnosis not present

## 2021-03-01 DIAGNOSIS — F4310 Post-traumatic stress disorder, unspecified: Secondary | ICD-10-CM | POA: Diagnosis not present

## 2021-03-01 DIAGNOSIS — G4700 Insomnia, unspecified: Secondary | ICD-10-CM | POA: Diagnosis not present

## 2021-03-22 DIAGNOSIS — F4310 Post-traumatic stress disorder, unspecified: Secondary | ICD-10-CM | POA: Diagnosis not present

## 2021-03-22 DIAGNOSIS — F411 Generalized anxiety disorder: Secondary | ICD-10-CM | POA: Diagnosis not present

## 2021-03-22 DIAGNOSIS — G4700 Insomnia, unspecified: Secondary | ICD-10-CM | POA: Diagnosis not present

## 2021-03-22 DIAGNOSIS — F329 Major depressive disorder, single episode, unspecified: Secondary | ICD-10-CM | POA: Diagnosis not present

## 2021-03-24 DIAGNOSIS — Z634 Disappearance and death of family member: Secondary | ICD-10-CM | POA: Diagnosis not present

## 2021-03-24 DIAGNOSIS — F411 Generalized anxiety disorder: Secondary | ICD-10-CM | POA: Diagnosis not present

## 2021-04-01 DIAGNOSIS — Z20828 Contact with and (suspected) exposure to other viral communicable diseases: Secondary | ICD-10-CM | POA: Diagnosis not present

## 2021-04-01 DIAGNOSIS — R0981 Nasal congestion: Secondary | ICD-10-CM | POA: Diagnosis not present

## 2021-04-01 DIAGNOSIS — R300 Dysuria: Secondary | ICD-10-CM | POA: Diagnosis not present

## 2021-04-01 DIAGNOSIS — M791 Myalgia, unspecified site: Secondary | ICD-10-CM | POA: Diagnosis not present

## 2021-04-01 DIAGNOSIS — R051 Acute cough: Secondary | ICD-10-CM | POA: Diagnosis not present

## 2021-04-01 DIAGNOSIS — R519 Headache, unspecified: Secondary | ICD-10-CM | POA: Diagnosis not present

## 2021-04-05 DIAGNOSIS — F4310 Post-traumatic stress disorder, unspecified: Secondary | ICD-10-CM | POA: Diagnosis not present

## 2021-04-05 DIAGNOSIS — G4700 Insomnia, unspecified: Secondary | ICD-10-CM | POA: Diagnosis not present

## 2021-04-05 DIAGNOSIS — F411 Generalized anxiety disorder: Secondary | ICD-10-CM | POA: Diagnosis not present

## 2021-04-05 DIAGNOSIS — F329 Major depressive disorder, single episode, unspecified: Secondary | ICD-10-CM | POA: Diagnosis not present

## 2021-04-06 DIAGNOSIS — R051 Acute cough: Secondary | ICD-10-CM | POA: Diagnosis not present

## 2021-04-06 DIAGNOSIS — Z20828 Contact with and (suspected) exposure to other viral communicable diseases: Secondary | ICD-10-CM | POA: Diagnosis not present

## 2021-04-18 DIAGNOSIS — F411 Generalized anxiety disorder: Secondary | ICD-10-CM | POA: Diagnosis not present

## 2021-04-18 DIAGNOSIS — Z634 Disappearance and death of family member: Secondary | ICD-10-CM | POA: Diagnosis not present

## 2021-04-18 DIAGNOSIS — F4310 Post-traumatic stress disorder, unspecified: Secondary | ICD-10-CM | POA: Diagnosis not present

## 2021-05-01 DIAGNOSIS — F4310 Post-traumatic stress disorder, unspecified: Secondary | ICD-10-CM | POA: Diagnosis not present

## 2021-05-01 DIAGNOSIS — F411 Generalized anxiety disorder: Secondary | ICD-10-CM | POA: Diagnosis not present

## 2021-05-01 DIAGNOSIS — G4700 Insomnia, unspecified: Secondary | ICD-10-CM | POA: Diagnosis not present

## 2021-05-01 DIAGNOSIS — F329 Major depressive disorder, single episode, unspecified: Secondary | ICD-10-CM | POA: Diagnosis not present

## 2021-05-10 NOTE — Telephone Encounter (Signed)
Glad she is feeling better I would recomm that she have a 72 hour holter monitor to evaluate for PVC burden on lower dose of atenolol

## 2021-05-10 NOTE — Telephone Encounter (Signed)
Attempted to reach pt by phone.  She answer but she could not hear me.  She said "i'm in a bad area and they're trying to call me." We were disconnected.  I messaged her back to see how she is feeling and will route to Dr. Harrington Challenger to make aware.

## 2021-05-11 ENCOUNTER — Other Ambulatory Visit: Payer: Self-pay | Admitting: Internal Medicine

## 2021-05-11 ENCOUNTER — Ambulatory Visit (INDEPENDENT_AMBULATORY_CARE_PROVIDER_SITE_OTHER): Payer: BC Managed Care – PPO

## 2021-05-11 ENCOUNTER — Other Ambulatory Visit: Payer: Self-pay | Admitting: *Deleted

## 2021-05-11 DIAGNOSIS — I493 Ventricular premature depolarization: Secondary | ICD-10-CM

## 2021-05-11 NOTE — Progress Notes (Unsigned)
Patient enrolled for [employer] to mail a 3 day ZIO XT monitor to address on file.

## 2021-05-14 DIAGNOSIS — I493 Ventricular premature depolarization: Secondary | ICD-10-CM | POA: Diagnosis not present

## 2021-05-19 DIAGNOSIS — I493 Ventricular premature depolarization: Secondary | ICD-10-CM | POA: Diagnosis not present

## 2021-06-22 ENCOUNTER — Ambulatory Visit: Payer: BC Managed Care – PPO | Admitting: Physician Assistant

## 2021-06-22 DIAGNOSIS — R233 Spontaneous ecchymoses: Secondary | ICD-10-CM | POA: Diagnosis not present

## 2021-06-22 DIAGNOSIS — L578 Other skin changes due to chronic exposure to nonionizing radiation: Secondary | ICD-10-CM | POA: Diagnosis not present

## 2021-06-29 ENCOUNTER — Encounter: Payer: Self-pay | Admitting: Physician Assistant

## 2021-06-29 ENCOUNTER — Ambulatory Visit: Payer: BC Managed Care – PPO | Admitting: Physician Assistant

## 2021-06-29 ENCOUNTER — Other Ambulatory Visit: Payer: Self-pay

## 2021-06-29 VITALS — BP 136/83 | HR 61 | Temp 99.4°F | Ht 66.0 in | Wt 219.2 lb

## 2021-06-29 DIAGNOSIS — G2581 Restless legs syndrome: Secondary | ICD-10-CM | POA: Diagnosis not present

## 2021-06-29 DIAGNOSIS — F4310 Post-traumatic stress disorder, unspecified: Secondary | ICD-10-CM | POA: Diagnosis not present

## 2021-06-29 DIAGNOSIS — E781 Pure hyperglyceridemia: Secondary | ICD-10-CM | POA: Diagnosis not present

## 2021-06-29 DIAGNOSIS — Z7689 Persons encountering health services in other specified circumstances: Secondary | ICD-10-CM

## 2021-06-29 DIAGNOSIS — Z1211 Encounter for screening for malignant neoplasm of colon: Secondary | ICD-10-CM

## 2021-06-29 DIAGNOSIS — E7800 Pure hypercholesterolemia, unspecified: Secondary | ICD-10-CM | POA: Diagnosis not present

## 2021-06-29 MED ORDER — SERTRALINE HCL 50 MG PO TABS: 50.0000 mg | ORAL_TABLET | Freq: Every day | ORAL | 1 refills | Status: DC

## 2021-06-29 NOTE — Patient Instructions (Addendum)
Very good to meet you today!  I will send a referral to psychology locally. Please start walking 15 minutes daily at least, and stretch legs well. Goal water 80 oz daily. See you back in 3-4 months Call sooner if any concerns
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
## 2021-07-03 ENCOUNTER — Encounter: Payer: Self-pay | Admitting: Gastroenterology

## 2021-07-26 ENCOUNTER — Ambulatory Visit (INDEPENDENT_AMBULATORY_CARE_PROVIDER_SITE_OTHER): Payer: BC Managed Care – PPO | Admitting: Psychologist

## 2021-07-26 DIAGNOSIS — Z634 Disappearance and death of family member: Secondary | ICD-10-CM

## 2021-07-26 DIAGNOSIS — F331 Major depressive disorder, recurrent, moderate: Secondary | ICD-10-CM

## 2021-07-27 ENCOUNTER — Ambulatory Visit (AMBULATORY_SURGERY_CENTER): Payer: Self-pay

## 2021-07-27 ENCOUNTER — Encounter: Payer: Self-pay | Admitting: Gastroenterology

## 2021-07-27 ENCOUNTER — Other Ambulatory Visit: Payer: Self-pay

## 2021-07-27 VITALS — Ht 66.0 in | Wt 220.8 lb

## 2021-07-27 DIAGNOSIS — Z1211 Encounter for screening for malignant neoplasm of colon: Secondary | ICD-10-CM

## 2021-07-27 MED ORDER — NA SULFATE-K SULFATE-MG SULF 17.5-3.13-1.6 GM/177ML PO SOLN: 1.0000 | Freq: Once | ORAL | 0 refills | Status: AC

## 2021-07-27 NOTE — Progress Notes (Signed)
No egg or soy allergy known to patient  No issues known to pt with past sedation with any surgeries or procedures Patient denies ever being told they had issues or difficulty with intubation  No FH of Malignant Hyperthermia Pt is not on diet pills Pt is not on home 02  Pt is not on blood thinners  Pt denies issues with constipation at this time;  No A fib or A flutter Coupon given to pt in PV today and NO PA's for preps discussed with pt in PV today  Discussed with pt there will be an out-of-pocket cost for prep and that varies from $0 to 70 +  dollars - pt verbalized understanding  Due to the COVID-19 pandemic we are asking patients to follow certain guidelines in PV and the [facility]   Pt aware of COVID protocols and [facility] guidelines

## 2021-08-02 ENCOUNTER — Ambulatory Visit (INDEPENDENT_AMBULATORY_CARE_PROVIDER_SITE_OTHER): Payer: BC Managed Care – PPO | Admitting: Psychologist

## 2021-08-02 DIAGNOSIS — F331 Major depressive disorder, recurrent, moderate: Secondary | ICD-10-CM | POA: Diagnosis not present

## 2021-08-02 DIAGNOSIS — Z634 Disappearance and death of family member: Secondary | ICD-10-CM

## 2021-08-11 ENCOUNTER — Encounter: Payer: BC Managed Care – PPO | Admitting: Gastroenterology

## 2021-08-18 ENCOUNTER — Ambulatory Visit (INDEPENDENT_AMBULATORY_CARE_PROVIDER_SITE_OTHER): Payer: BC Managed Care – PPO | Admitting: Psychologist

## 2021-08-18 DIAGNOSIS — Z634 Disappearance and death of family member: Secondary | ICD-10-CM | POA: Diagnosis not present

## 2021-08-18 DIAGNOSIS — F331 Major depressive disorder, recurrent, moderate: Secondary | ICD-10-CM

## 2021-08-21 ENCOUNTER — Ambulatory Visit (INDEPENDENT_AMBULATORY_CARE_PROVIDER_SITE_OTHER): Payer: BC Managed Care – PPO | Admitting: Psychologist

## 2021-08-21 DIAGNOSIS — F331 Major depressive disorder, recurrent, moderate: Secondary | ICD-10-CM

## 2021-08-21 DIAGNOSIS — Z634 Disappearance and death of family member: Secondary | ICD-10-CM

## 2021-09-13 ENCOUNTER — Ambulatory Visit: Payer: BC Managed Care – PPO | Admitting: Psychologist

## 2021-09-20 ENCOUNTER — Encounter: Payer: Self-pay | Admitting: Gastroenterology

## 2021-09-20 ENCOUNTER — Other Ambulatory Visit: Payer: Self-pay

## 2021-09-20 ENCOUNTER — Ambulatory Visit (AMBULATORY_SURGERY_CENTER): Payer: BC Managed Care – PPO | Admitting: Gastroenterology

## 2021-09-20 VITALS — BP 132/65 | HR 55 | Temp 97.1°F | Resp 12 | Ht 66.0 in | Wt 220.8 lb

## 2021-09-20 DIAGNOSIS — Z1211 Encounter for screening for malignant neoplasm of colon: Secondary | ICD-10-CM | POA: Diagnosis not present

## 2021-09-20 DIAGNOSIS — K635 Polyp of colon: Secondary | ICD-10-CM

## 2021-09-20 DIAGNOSIS — D125 Benign neoplasm of sigmoid colon: Secondary | ICD-10-CM

## 2021-09-20 MED ORDER — SODIUM CHLORIDE 0.9 % IV SOLN: 500.0000 mL | Freq: Once | INTRAVENOUS | Status: DC

## 2021-09-20 NOTE — Progress Notes (Signed)
To PACU, VSS. Report to [email or web address] 
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
## 2021-09-20 NOTE — Op Note (Addendum)
[name] Patient Name: Savannah Dennis Procedure Date: 09/20/2021 9:41 AM MRN: [id number] Endoscopist: Thornton Park MD, MD Age: 50 Referring MD:  Date of Birth: [birth year] Gender: Female Account #: [id number] Procedure:                Colonoscopy Indications:              Screening for colorectal malignant neoplasm, This                            is the patient's first colonoscopy                           No known family history of colon cancer or polyps Medicines:                Monitored Anesthesia Care Procedure:                Pre-Anesthesia Assessment:                           - Prior to the procedure, a History and Physical                            was performed, and patient medications and                            allergies were reviewed. The patient's tolerance of                            previous anesthesia was also reviewed. The risks                            and benefits of the procedure and the sedation                            options and risks were discussed with the patient.                            All questions were answered, and informed consent                            was obtained. Prior Anticoagulants: The patient has                            taken no previous anticoagulant or antiplatelet                            agents. ASA Grade Assessment: II - A patient with                            mild systemic disease. After reviewing the risks                            and benefits, the patient was deemed in  satisfactory condition to undergo the procedure.                           After obtaining informed consent, the colonoscope                            was passed under direct vision. Throughout the                            procedure, the patient's blood pressure, pulse, and                            oxygen saturations were monitored continuously. The                            Olympus CF-HQ190L [id number]  was introduced through                            the anus and advanced to the 3 cm into the ileum. A                            second forward view of the right colon was                            performed. The colonoscopy was performed without                            difficulty. The patient tolerated the procedure                            well. The quality of the bowel preparation was                            good. The terminal ileum, ileocecal valve,                            appendiceal orifice, and rectum were photographed. Scope In: 9:54:27 AM Scope Out: 10:12:41 AM Scope Withdrawal Time: 0 hours 13 minutes 22 seconds  Total Procedure Duration: 0 hours 18 minutes 14 seconds  Findings:                 The perianal and digital rectal examinations were                            normal except for external hemorrhoids.                           A few small and large-mouthed diverticula were                            found in the sigmoid colon.                           A 2 mm polyp was found in the sigmoid colon. The  polyp was flat. The polyp was removed with a cold                            snare. Resection and retrieval were complete.                            Estimated blood loss was minimal.                           The exam was otherwise without abnormality on                            direct and retroflexion views except for small                            internal hemorrhoids. Complications:            No immediate complications. Estimated blood loss:                            Minimal. Estimated Blood Loss:     Estimated blood loss was minimal. Impression:               - Diverticulosis in the sigmoid colon.                           - One 2 mm polyp in the sigmoid colon, removed with                            a cold snare. Resected and retrieved.                           - The examination was otherwise normal on direct                             and retroflexion views. Recommendation:           - Patient has a contact number available for                            emergencies. The signs and symptoms of potential                            delayed complications were discussed with the                            patient. Return to normal activities tomorrow.                            Written discharge instructions were provided to the                            patient.                           - Resume previous diet. High fiber diet  recommended. Consider a daily dose of psyllium.                           - Continue present medications.                           - Await pathology results.                           - Repeat colonoscopy date to be determined after                            pending pathology results are reviewed for                            surveillance.                           - Emerging evidence supports eating a diet of                            fruits, vegetables, grains, calcium, and yogurt                            while reducing red meat and alcohol may reduce the                            risk of colon cancer.                           - Thank you for allowing me to be involved in your                            colon cancer prevention. Thornton Park MD, MD 09/20/2021 10:18:21 AM This report has been signed electronically.

## 2021-09-20 NOTE — Patient Instructions (Signed)
Handouts on polyps and diverticulosis given to you today   YOU HAD AN ENDOSCOPIC PROCEDURE TODAY AT THE [facility]:   Refer to the procedure report that was given to you for any specific questions about what was found during the examination.  If the procedure report does not answer your questions, please call your gastroenterologist to clarify.  If you requested that your care partner not be given the details of your procedure findings, then the procedure report has been included in a sealed envelope for you to review at your convenience later.  YOU SHOULD EXPECT: Some feelings of bloating in the abdomen. Passage of more gas than usual.  Walking can help get rid of the air that was put into your GI tract during the procedure and reduce the bloating. If you had a lower endoscopy (such as a colonoscopy or flexible sigmoidoscopy) you may notice spotting of blood in your stool or on the toilet paper. If you underwent a bowel prep for your procedure, you may not have a normal bowel movement for a few days.  Please Note:  You might notice some irritation and congestion in your nose or some drainage.  This is from the oxygen used during your procedure.  There is no need for concern and it should clear up in a day or so.  SYMPTOMS TO REPORT IMMEDIATELY:  Following lower endoscopy (colonoscopy or flexible sigmoidoscopy):  Excessive amounts of blood in the stool  Significant tenderness or worsening of abdominal pains  Swelling of the abdomen that is new, acute  Fever of 100F or higher  For urgent or emergent issues, a gastroenterologist can be reached at any hour by calling [phone]. Do not use MyChart messaging for urgent concerns.    DIET:  We do recommend a small meal at first, but then you may proceed to your regular diet.  Drink plenty of fluids but you should avoid alcoholic beverages for 24 hours.  ACTIVITY:  You should plan to take it easy for the rest of today and you should  NOT DRIVE or use heavy machinery until tomorrow (because of the sedation medicines used during the test).    FOLLOW UP: Our staff will call the number listed on your records 48-72 hours following your procedure to check on you and address any questions or concerns that you may have regarding the information given to you following your procedure. If we do not reach you, we will leave a message.  We will attempt to reach you two times.  During this call, we will ask if you have developed any symptoms of COVID 19. If you develop any symptoms (ie: fever, flu-like symptoms, shortness of breath, cough etc.) before then, please call [phone].  If you test positive for Covid 19 in the 2 weeks post procedure, please call and report this information to us.    If any biopsies were taken you will be contacted by phone or by letter within the next 1-3 weeks.  Please call us at [phone] if you have not heard about the biopsies in 3 weeks.    SIGNATURES/CONFIDENTIALITY: You and/or your care partner have signed paperwork which will be entered into your electronic medical record.  These signatures attest to the fact that that the information above on your After Visit Summary has been reviewed and is understood.  Full responsibility of the confidentiality of this discharge information lies with you and/or your care-partner.  

## 2021-09-20 NOTE — Progress Notes (Signed)
Called to room to assist during endoscopic procedure.  Patient ID and intended procedure confirmed with present staff. Received instructions for my participation in the procedure from the performing physician.  

## 2021-09-22 ENCOUNTER — Telehealth: Payer: Self-pay | Admitting: *Deleted

## 2021-09-22 ENCOUNTER — Telehealth: Payer: Self-pay

## 2021-09-22 NOTE — Telephone Encounter (Signed)
Attempted to call patient for their post-procedure follow-up call. No answer. Left voicemail.   

## 2021-09-22 NOTE — Telephone Encounter (Signed)
  Follow up Call-  Call back number 09/20/2021  Post procedure Call Back phone  # [phone]  Permission to leave phone message Yes  Some recent data might be hidden     Patient questions:  Do you have a fever, pain , or abdominal swelling? No. Pain Score  0 *  Have you tolerated food without any problems? Yes.    Have you been able to return to your normal activities? Yes.    Do you have any questions about your discharge instructions: Diet   No. Medications  No. Follow up visit  No.  Do you have questions or concerns about your Care? No.  Actions: * If pain score is 4 or above: No action needed, pain <4.

## 2021-09-26 ENCOUNTER — Encounter: Payer: Self-pay | Admitting: Gastroenterology

## 2021-09-29 ENCOUNTER — Other Ambulatory Visit: Payer: Self-pay

## 2021-09-29 ENCOUNTER — Ambulatory Visit: Payer: BC Managed Care – PPO | Admitting: Physician Assistant

## 2021-09-29 ENCOUNTER — Encounter: Payer: Self-pay | Admitting: Physician Assistant

## 2021-09-29 VITALS — BP 127/85 | HR 61 | Temp 98.0°F | Ht 66.0 in | Wt 224.2 lb

## 2021-09-29 DIAGNOSIS — R5383 Other fatigue: Secondary | ICD-10-CM

## 2021-09-29 DIAGNOSIS — M25552 Pain in left hip: Secondary | ICD-10-CM

## 2021-09-29 DIAGNOSIS — F4310 Post-traumatic stress disorder, unspecified: Secondary | ICD-10-CM | POA: Diagnosis not present

## 2021-09-29 DIAGNOSIS — M129 Arthropathy, unspecified: Secondary | ICD-10-CM | POA: Diagnosis not present

## 2021-09-29 DIAGNOSIS — G2581 Restless legs syndrome: Secondary | ICD-10-CM

## 2021-09-29 DIAGNOSIS — R898 Other abnormal findings in specimens from other organs, systems and tissues: Secondary | ICD-10-CM
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
## 2021-09-29 NOTE — Patient Instructions (Signed)
Good to see you again. Please go to the lab and I will MyChart with results. I will also get back to you about our genetics and psychology teams. Please call sooner if any concerns.      Due to recent changes in healthcare laws, you may see results of your imaging and/or laboratory studies on MyChart before I have had a chance to review them.  I understand that in some cases there may be results that are confusing or concerning to you. Please understand that not all results are received at the same time and often I may need to interpret multiple results in order to provide you with the best plan of care or course of treatment. Therefore, I ask that you please give me 2 business days to thoroughly review all your results before contacting my office for clarification. Should we see a critical lab result, you will be contacted sooner.

## 2021-09-30 LAB — CBC WITH DIFFERENTIAL/PLATELET
Basophils Absolute: 0 10*3/uL (ref 0.0–0.2)
Basos: 1 %
EOS (ABSOLUTE): 0.1 10*3/uL (ref 0.0–0.4)
Eos: 2 %
Hematocrit: 40.7 % (ref 34.0–46.6)
Hemoglobin: 14.1 g/dL (ref 11.1–15.9)
Immature Grans (Abs): 0 10*3/uL (ref 0.0–0.1)
Immature Granulocytes: 0 %
Lymphocytes Absolute: 1.9 10*3/uL (ref 0.7–3.1)
Lymphs: 24 %
MCH: 31.4 pg (ref 26.6–33.0)
MCHC: 34.6 g/dL (ref 31.5–35.7)
MCV: 91 fL (ref 79–97)
Monocytes Absolute: 0.5 10*3/uL (ref 0.1–0.9)
Monocytes: 7 %
Neutrophils Absolute: 5.2 10*3/uL (ref 1.4–7.0)
Neutrophils: 66 %
Platelets: 235 10*3/uL (ref 150–450)
RBC: 4.49 x10E6/uL (ref 3.77–5.28)
RDW: 11.7 % (ref 11.7–15.4)
WBC: 7.8 10*3/uL (ref 3.4–10.8)

## 2021-09-30 LAB — IRON,TIBC AND FERRITIN PANEL
Ferritin: 18 ng/mL (ref 15–150)
Iron Saturation: 34 % (ref 15–55)
Iron: 129 ug/dL (ref 27–159)
Total Iron Binding Capacity: 377 ug/dL (ref 250–450)
UIBC: 248 ug/dL (ref 131–425)

## 2021-09-30 LAB — COMPREHENSIVE METABOLIC PANEL
ALT: 15 IU/L (ref 0–32)
AST: 22 IU/L (ref 0–40)
Albumin/Globulin Ratio: 1.6 (ref 1.2–2.2)
Albumin: 4.1 g/dL (ref 3.8–4.9)
Alkaline Phosphatase: 80 IU/L (ref 44–121)
BUN/Creatinine Ratio: 12 (ref 9–23)
BUN: 8 mg/dL (ref 6–24)
Bilirubin Total: 0.3 mg/dL (ref 0.0–1.2)
CO2: 23 mmol/L (ref 20–29)
Calcium: 9.1 mg/dL (ref 8.7–10.2)
Chloride: 102 mmol/L (ref 96–106)
Creatinine, Ser: 0.65 mg/dL (ref 0.57–1.00)
Globulin, Total: 2.5 g/dL (ref 1.5–4.5)
Glucose: 77 mg/dL (ref 70–99)
Potassium: 4.1 mmol/L (ref 3.5–5.2)
Sodium: 140 mmol/L (ref 134–144)
Total Protein: 6.6 g/dL (ref 6.0–8.5)
eGFR: 106 mL/min/{1.73_m2} (ref >59)

## 2021-09-30 LAB — TSH: TSH: 2.62 u[IU]/mL (ref 0.450–4.500)

## 2021-09-30 LAB — RHEUMATOID FACTOR: Rheumatoid fact SerPl-aCnc: <10 IU/mL (ref <14.0)

## 2021-09-30 LAB — VITAMIN D 25 HYDROXY (VIT D DEFICIENCY, FRACTURES): Vit D, 25-Hydroxy: 42.5 ng/mL (ref 30.0–100.0)

## 2021-09-30 LAB — VITAMIN B12: Vitamin B-12: >2000 pg/mL — ABNORMAL HIGH (ref 232–1245)

## 2021-09-30 LAB — ANA: Anti Nuclear Antibody (ANA): NEGATIVE

## 2021-09-30 LAB — SEDIMENTATION RATE: Sed Rate: 19 mm/hr (ref 0–40)

## 2021-10-02 ENCOUNTER — Encounter: Payer: Self-pay | Admitting: Physician Assistant

## 2021-10-05 DIAGNOSIS — Z1231 Encounter for screening mammogram for malignant neoplasm of breast: Secondary | ICD-10-CM | POA: Diagnosis not present

## 2021-11-16 DIAGNOSIS — M25531 Pain in right wrist: Secondary | ICD-10-CM | POA: Diagnosis not present

## 2021-11-16 DIAGNOSIS — M25522 Pain in left elbow: Secondary | ICD-10-CM | POA: Diagnosis not present

## 2021-11-22 DIAGNOSIS — M25531 Pain in right wrist: Secondary | ICD-10-CM | POA: Diagnosis not present

## 2021-12-02 DIAGNOSIS — R531 Weakness: Secondary | ICD-10-CM | POA: Diagnosis not present

## 2021-12-02 DIAGNOSIS — R0602 Shortness of breath: Secondary | ICD-10-CM | POA: Diagnosis not present

## 2021-12-02 DIAGNOSIS — N939 Abnormal uterine and vaginal bleeding, unspecified: Secondary | ICD-10-CM | POA: Diagnosis not present

## 2021-12-02 DIAGNOSIS — M79605 Pain in left leg: Secondary | ICD-10-CM | POA: Diagnosis not present

## 2021-12-02 DIAGNOSIS — N83202 Unspecified ovarian cyst, left side: Secondary | ICD-10-CM | POA: Diagnosis not present

## 2021-12-02 DIAGNOSIS — N938 Other specified abnormal uterine and vaginal bleeding: Secondary | ICD-10-CM | POA: Diagnosis not present

## 2021-12-02 DIAGNOSIS — M79604 Pain in right leg: Secondary | ICD-10-CM | POA: Diagnosis not present

## 2021-12-02 DIAGNOSIS — M7989 Other specified soft tissue disorders: Secondary | ICD-10-CM | POA: Diagnosis not present

## 2021-12-02 DIAGNOSIS — N888 Other specified noninflammatory disorders of cervix uteri: Secondary | ICD-10-CM | POA: Diagnosis not present

## 2021-12-04 DIAGNOSIS — D500 Iron deficiency anemia secondary to blood loss (chronic): Secondary | ICD-10-CM | POA: Insufficient documentation

## 2021-12-04 DIAGNOSIS — H35373 Puckering of macula, bilateral: Secondary | ICD-10-CM | POA: Diagnosis not present

## 2021-12-04 HISTORY — DX: Iron deficiency anemia secondary to blood loss (chronic): D50.0

## 2021-12-07 DIAGNOSIS — N921 Excessive and frequent menstruation with irregular cycle: Secondary | ICD-10-CM | POA: Diagnosis not present

## 2021-12-07 DIAGNOSIS — N939 Abnormal uterine and vaginal bleeding, unspecified: Secondary | ICD-10-CM | POA: Diagnosis not present

## 2021-12-07 DIAGNOSIS — N83292 Other ovarian cyst, left side: Secondary | ICD-10-CM | POA: Diagnosis not present

## 2021-12-07 DIAGNOSIS — R9389 Abnormal findings on diagnostic imaging of other specified body structures: Secondary | ICD-10-CM | POA: Diagnosis not present

## 2021-12-07 DIAGNOSIS — Z98891 History of uterine scar from previous surgery: Secondary | ICD-10-CM | POA: Diagnosis not present

## 2021-12-07 DIAGNOSIS — D649 Anemia, unspecified: Secondary | ICD-10-CM | POA: Diagnosis not present
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
## 2021-12-25 ENCOUNTER — Encounter: Payer: Self-pay | Admitting: Internal Medicine

## 2021-12-25 ENCOUNTER — Ambulatory Visit: Payer: BC Managed Care – PPO | Admitting: Internal Medicine

## 2021-12-25 ENCOUNTER — Other Ambulatory Visit: Payer: Self-pay

## 2021-12-25 VITALS — BP 130/84 | HR 70 | Ht 66.0 in | Wt 227.0 lb

## 2021-12-25 DIAGNOSIS — I493 Ventricular premature depolarization: Secondary | ICD-10-CM | POA: Diagnosis not present

## 2021-12-25 DIAGNOSIS — E669 Obesity, unspecified: Secondary | ICD-10-CM | POA: Diagnosis not present

## 2021-12-25 DIAGNOSIS — Z79899 Other long term (current) drug therapy: Secondary | ICD-10-CM

## 2021-12-25 NOTE — Patient Instructions (Signed)
Medication Instructions:  ?Your physician recommends that you continue on your current medications as directed. Please refer to the Current Medication list given to you today. ? ?*If you need a refill on your cardiac medications before your next appointment, please call your pharmacy* ? ? ?Lab Work: ?CMET, CBC, PRO BNP, LIPID ?If you have labs (blood work) drawn today and your tests are completely normal, you will receive your results only by: ?MyChart Message (if you have MyChart) OR ?A paper copy in the mail ?If you have any lab test that is abnormal or we need to change your treatment, we will call you to review the results. ? ? ?Testing/Procedures: ?NONE ? ? ?Follow-Up: ?At [facility], you and your health needs are our priority.  As part of our continuing mission to provide you with exceptional heart care, we have created designated Provider Care Teams.  These Care Teams include your primary Cardiologist (physician) and Advanced Practice Providers (APPs -  Physician Assistants and Nurse Practitioners) who all work together to provide you with the care you need, when you need it. ? ?We recommend signing up for the patient portal called "MyChart".  Sign up information is provided on this After Visit Summary.  MyChart is used to connect with patients for Virtual Visits (Telemedicine).  Patients are able to view lab/test results, encounter notes, upcoming appointments, etc.  Non-urgent messages can be sent to your provider as well.   ?To learn more about what you can do with MyChart, go to [email or web address].   ? ?Your next appointment:   ?1 year(s) ? ?The format for your next appointment:   ?In Person ? ?Provider:   ?Dorris Carnes, MD   ? ? ?Other Instructions ?  ?

## 2021-12-26 LAB — PRO B NATRIURETIC PEPTIDE: NT-Pro BNP: 199 pg/mL (ref 0–249)

## 2021-12-26 LAB — LIPID PANEL
Chol/HDL Ratio: 3 ratio (ref 0.0–4.4)
Cholesterol, Total: 150 mg/dL (ref 100–199)
HDL: 50 mg/dL (ref >39)
LDL Chol Calc (NIH): 80 mg/dL (ref 0–99)
Triglycerides: 112 mg/dL (ref 0–149)
VLDL Cholesterol Cal: 20 mg/dL (ref 5–40)

## 2021-12-26 LAB — COMPREHENSIVE METABOLIC PANEL
ALT: 12 IU/L (ref 0–32)
AST: 20 IU/L (ref 0–40)
Albumin/Globulin Ratio: 1.8 (ref 1.2–2.2)
Albumin: 4.2 g/dL (ref 3.8–4.9)
Alkaline Phosphatase: 69 IU/L (ref 44–121)
BUN/Creatinine Ratio: 18 (ref 9–23)
BUN: 13 mg/dL (ref 6–24)
Bilirubin Total: <0.2 mg/dL (ref 0.0–1.2)
CO2: 23 mmol/L (ref 20–29)
Calcium: 9.4 mg/dL (ref 8.7–10.2)
Chloride: 106 mmol/L (ref 96–106)
Creatinine, Ser: 0.71 mg/dL (ref 0.57–1.00)
Globulin, Total: 2.3 g/dL (ref 1.5–4.5)
Glucose: 95 mg/dL (ref 70–99)
Potassium: 4.6 mmol/L (ref 3.5–5.2)
Sodium: 142 mmol/L (ref 134–144)
Total Protein: 6.5 g/dL (ref 6.0–8.5)
eGFR: 102 mL/min/{1.73_m2} (ref >59)

## 2021-12-26 LAB — CBC
Hematocrit: 29.4 % — ABNORMAL LOW (ref 34.0–46.6)
Hemoglobin: 9 g/dL — ABNORMAL LOW (ref 11.1–15.9)
MCH: 28.5 pg (ref 26.6–33.0)
MCHC: 30.6 g/dL — ABNORMAL LOW (ref 31.5–35.7)
MCV: 93 fL (ref 79–97)
Platelets: 319 10*3/uL (ref 150–450)
RBC: 3.16 x10E6/uL — ABNORMAL LOW (ref 3.77–5.28)
RDW: 12.1 % (ref 11.7–15.4)
WBC: 8.3 10*3/uL (ref 3.4–10.8)

## 2021-12-27 ENCOUNTER — Telehealth: Payer: Self-pay | Admitting: Internal Medicine

## 2021-12-27 ENCOUNTER — Encounter: Payer: Self-pay | Admitting: Internal Medicine

## 2021-12-27 NOTE — Telephone Encounter (Signed)
Spoke with pt and let her know that Dr. Harrington Challenger has not reviewed her labs as of yet.  Advised we would be in touch as soon as she does.  Pt appreciative for call.  ?

## 2021-12-27 NOTE — Telephone Encounter (Signed)
Patient had appt with Dr. Garen Grams 12/25/21 and was told she would get a follow up call the next day to go over her lab results. The patient has not heard anything from the office yet and she is just checking in. ? ?Please call the patient  ?

## 2021-12-28 ENCOUNTER — Encounter: Payer: Self-pay | Admitting: Internal Medicine

## 2021-12-29 ENCOUNTER — Ambulatory Visit (INDEPENDENT_AMBULATORY_CARE_PROVIDER_SITE_OTHER): Payer: BC Managed Care – PPO

## 2021-12-29 DIAGNOSIS — Z79899 Other long term (current) drug therapy: Secondary | ICD-10-CM

## 2021-12-29 DIAGNOSIS — E669 Obesity, unspecified: Secondary | ICD-10-CM

## 2021-12-29 DIAGNOSIS — N921 Excessive and frequent menstruation with irregular cycle: Secondary | ICD-10-CM | POA: Diagnosis not present

## 2021-12-29 DIAGNOSIS — Z98891 History of uterine scar from previous surgery: Secondary | ICD-10-CM | POA: Diagnosis not present

## 2021-12-29 DIAGNOSIS — I493 Ventricular premature depolarization: Secondary | ICD-10-CM

## 2021-12-29 NOTE — Telephone Encounter (Signed)
Pt called and advised 

## 2021-12-29 NOTE — Addendum Note (Signed)
Addended by: Stephani Police on: 12/29/2021 11:43 AM ? ? Modules accepted: Orders ? ?

## 2021-12-29 NOTE — Progress Notes (Unsigned)
Enrolled patient for a 14 day Zio XT  monitor to be mailed to patients home  °

## 2022-01-01 DIAGNOSIS — I493 Ventricular premature depolarization: Secondary | ICD-10-CM

## 2022-01-01 DIAGNOSIS — E669 Obesity, unspecified: Secondary | ICD-10-CM

## 2022-01-01 DIAGNOSIS — Z79899 Other long term (current) drug therapy: Secondary | ICD-10-CM | POA: Diagnosis not present

## 2022-01-03 DIAGNOSIS — G2581 Restless legs syndrome: Secondary | ICD-10-CM

## 2022-01-03 DIAGNOSIS — R252 Cramp and spasm: Secondary | ICD-10-CM | POA: Diagnosis not present

## 2022-01-03 DIAGNOSIS — M2550 Pain in unspecified joint: Secondary | ICD-10-CM | POA: Diagnosis not present

## 2022-01-03 HISTORY — DX: Restless legs syndrome: G25.81

## 2022-01-22 DIAGNOSIS — I493 Ventricular premature depolarization: Secondary | ICD-10-CM | POA: Diagnosis not present

## 2022-01-22 DIAGNOSIS — E669 Obesity, unspecified: Secondary | ICD-10-CM | POA: Diagnosis not present

## 2022-01-25 ENCOUNTER — Encounter: Payer: Self-pay | Admitting: Internal Medicine

## 2022-01-25 ENCOUNTER — Telehealth: Payer: Self-pay | Admitting: Internal Medicine

## 2022-01-25 NOTE — Telephone Encounter (Signed)
? ?  Pt is calling regarding her heart monitor result. She said she has some questions about it  ?

## 2022-01-25 NOTE — Telephone Encounter (Signed)
Pt aware of monitor results Also pt noted ankle edema and is sending picture via mychart Per pt this has been going on for a couple of weeks ./cy ?

## 2022-01-25 NOTE — Telephone Encounter (Signed)
Monitor shows Sinus rhythm, sinus tachycardia    Average HR 67 ?Rare PAC, PVC   No siginnficant arrhythmias ?ANemia may have caused her to sense heart rates more  ?Has she had CBC checked recently ?   I would repeat  ?Stay hydrated  ?

## 2022-01-31 DIAGNOSIS — J019 Acute sinusitis, unspecified: Secondary | ICD-10-CM | POA: Diagnosis not present

## 2022-01-31 DIAGNOSIS — R051 Acute cough: Secondary | ICD-10-CM | POA: Diagnosis not present

## 2022-02-08 NOTE — Telephone Encounter (Signed)
With upcoming surgery do not want to change meds . ?After she recovers, can reassess.   Some patients take a low dose fluid pill a couple times per week ?Again, once recovered ?Overall limit salt ?

## 2022-02-13 DIAGNOSIS — D500 Iron deficiency anemia secondary to blood loss (chronic): Secondary | ICD-10-CM | POA: Diagnosis not present

## 2022-02-13 DIAGNOSIS — Z01818 Encounter for other preprocedural examination: Secondary | ICD-10-CM | POA: Diagnosis not present

## 2022-02-13 DIAGNOSIS — N921 Excessive and frequent menstruation with irregular cycle: Secondary | ICD-10-CM | POA: Diagnosis not present

## 2022-02-13 DIAGNOSIS — Z98891 History of uterine scar from previous surgery: Secondary | ICD-10-CM | POA: Diagnosis not present

## 2022-02-14 DIAGNOSIS — M2550 Pain in unspecified joint: Secondary | ICD-10-CM | POA: Diagnosis not present

## 2022-02-14 DIAGNOSIS — R700 Elevated erythrocyte sedimentation rate: Secondary | ICD-10-CM | POA: Diagnosis not present

## 2022-02-14 DIAGNOSIS — G8929 Other chronic pain: Secondary | ICD-10-CM | POA: Diagnosis not present

## 2022-02-16 DIAGNOSIS — N921 Excessive and frequent menstruation with irregular cycle: Secondary | ICD-10-CM | POA: Diagnosis not present

## 2022-02-16 DIAGNOSIS — Z01818 Encounter for other preprocedural examination: Secondary | ICD-10-CM | POA: Diagnosis not present

## 2022-02-21 DIAGNOSIS — D500 Iron deficiency anemia secondary to blood loss (chronic): Secondary | ICD-10-CM | POA: Diagnosis not present

## 2022-02-21 DIAGNOSIS — N921 Excessive and frequent menstruation with irregular cycle: Secondary | ICD-10-CM | POA: Diagnosis not present

## 2022-02-21 DIAGNOSIS — D251 Intramural leiomyoma of uterus: Secondary | ICD-10-CM | POA: Diagnosis not present

## 2022-02-21 DIAGNOSIS — Z90710 Acquired absence of both cervix and uterus: Secondary | ICD-10-CM

## 2022-02-21 DIAGNOSIS — Z8673 Personal history of transient ischemic attack (TIA), and cerebral infarction without residual deficits: Secondary | ICD-10-CM | POA: Diagnosis not present

## 2022-02-21 DIAGNOSIS — D259 Leiomyoma of uterus, unspecified: Secondary | ICD-10-CM | POA: Diagnosis not present

## 2022-02-21 HISTORY — DX: Acquired absence of both cervix and uterus: Z90.710

## 2022-03-20 DIAGNOSIS — N390 Urinary tract infection, site not specified: Secondary | ICD-10-CM | POA: Diagnosis not present

## 2022-04-03 ENCOUNTER — Encounter: Payer: BC Managed Care – PPO | Admitting: Physician Assistant

## 2022-06-22 DIAGNOSIS — M2550 Pain in unspecified joint: Secondary | ICD-10-CM | POA: Diagnosis not present

## 2022-06-22 DIAGNOSIS — R5383 Other fatigue: Secondary | ICD-10-CM | POA: Diagnosis not present

## 2022-06-22 DIAGNOSIS — Z0000 Encounter for general adult medical examination without abnormal findings: Secondary | ICD-10-CM | POA: Diagnosis not present

## 2022-07-09 ENCOUNTER — Encounter: Payer: Self-pay | Admitting: *Deleted

## 2022-07-16 DIAGNOSIS — R5383 Other fatigue: Secondary | ICD-10-CM | POA: Diagnosis not present

## 2022-07-16 DIAGNOSIS — M1990 Unspecified osteoarthritis, unspecified site: Secondary | ICD-10-CM | POA: Diagnosis not present

## 2022-07-16 DIAGNOSIS — Z0000 Encounter for general adult medical examination without abnormal findings: Secondary | ICD-10-CM | POA: Diagnosis not present

## 2022-07-16 DIAGNOSIS — L812 Freckles: Secondary | ICD-10-CM | POA: Diagnosis not present

## 2022-07-17 DIAGNOSIS — M359 Systemic involvement of connective tissue, unspecified: Secondary | ICD-10-CM | POA: Diagnosis not present

## 2022-07-17 DIAGNOSIS — M79641 Pain in right hand: Secondary | ICD-10-CM | POA: Diagnosis not present

## 2022-07-17 DIAGNOSIS — M542 Cervicalgia: Secondary | ICD-10-CM | POA: Diagnosis not present

## 2022-07-17 DIAGNOSIS — R5383 Other fatigue: Secondary | ICD-10-CM | POA: Diagnosis not present

## 2022-07-17 DIAGNOSIS — M79642 Pain in left hand: Secondary | ICD-10-CM | POA: Diagnosis not present

## 2022-07-17 DIAGNOSIS — M1990 Unspecified osteoarthritis, unspecified site: Secondary | ICD-10-CM | POA: Diagnosis not present

## 2022-08-11 DIAGNOSIS — I480 Paroxysmal atrial fibrillation: Secondary | ICD-10-CM

## 2022-08-11 DIAGNOSIS — I4891 Unspecified atrial fibrillation: Secondary | ICD-10-CM

## 2022-08-11 HISTORY — DX: Unspecified atrial fibrillation: I48.91

## 2022-08-11 HISTORY — DX: Paroxysmal atrial fibrillation: I48.0

## 2022-08-13 ENCOUNTER — Telehealth: Payer: Self-pay | Admitting: Internal Medicine

## 2022-08-13 ENCOUNTER — Emergency Department (HOSPITAL_COMMUNITY)
Admission: EM | Admit: 2022-08-13 | Discharge: 2022-08-13 | Discharge status: Against Medical Advice | Payer: BC Managed Care – PPO | Attending: Emergency Medicine | Admitting: Emergency Medicine

## 2022-08-13 ENCOUNTER — Other Ambulatory Visit: Payer: Self-pay

## 2022-08-13 ENCOUNTER — Emergency Department (HOSPITAL_COMMUNITY): Payer: BC Managed Care – PPO

## 2022-08-13 DIAGNOSIS — Z7982 Long term (current) use of aspirin: Secondary | ICD-10-CM | POA: Insufficient documentation

## 2022-08-13 DIAGNOSIS — Z5321 Procedure and treatment not carried out due to patient leaving prior to being seen by health care provider: Secondary | ICD-10-CM | POA: Insufficient documentation

## 2022-08-13 DIAGNOSIS — I4891 Unspecified atrial fibrillation: Secondary | ICD-10-CM | POA: Insufficient documentation

## 2022-08-13 DIAGNOSIS — R002 Palpitations: Secondary | ICD-10-CM | POA: Diagnosis not present

## 2022-08-13 DIAGNOSIS — R42 Dizziness and giddiness: Secondary | ICD-10-CM | POA: Diagnosis not present

## 2022-08-13 DIAGNOSIS — R0602 Shortness of breath: Secondary | ICD-10-CM | POA: Diagnosis not present

## 2022-08-13 LAB — MAGNESIUM: Magnesium: 2 mg/dL (ref 1.7–2.4)

## 2022-08-13 LAB — I-STAT BETA HCG BLOOD, ED (MC, WL, AP ONLY): I-stat hCG, quantitative: <5 m[IU]/mL (ref <5)

## 2022-08-13 LAB — CBC
HCT: 48 % — ABNORMAL HIGH (ref 36.0–46.0)
Hemoglobin: 15.7 g/dL — ABNORMAL HIGH (ref 12.0–15.0)
MCH: 30.4 pg (ref 26.0–34.0)
MCHC: 32.7 g/dL (ref 30.0–36.0)
MCV: 92.8 fL (ref 80.0–100.0)
Platelets: 264 10*3/uL (ref 150–400)
RBC: 5.17 MIL/uL — ABNORMAL HIGH (ref 3.87–5.11)
RDW: 12.2 % (ref 11.5–15.5)
WBC: 7.8 10*3/uL (ref 4.0–10.5)
nRBC: 0 % (ref 0.0–0.2)

## 2022-08-13 LAB — BASIC METABOLIC PANEL
Anion gap: 7 (ref 5–15)
BUN: 13 mg/dL (ref 6–20)
CO2: 20 mmol/L — ABNORMAL LOW (ref 22–32)
Calcium: 8.8 mg/dL — ABNORMAL LOW (ref 8.9–10.3)
Chloride: 111 mmol/L (ref 98–111)
Creatinine, Ser: 0.77 mg/dL (ref 0.44–1.00)
GFR, Estimated: >60 mL/min (ref >60)
Glucose, Bld: 107 mg/dL — ABNORMAL HIGH (ref 70–99)
Potassium: 4.3 mmol/L (ref 3.5–5.1)
Sodium: 138 mmol/L (ref 135–145)

## 2022-08-13 LAB — TROPONIN I (HIGH SENSITIVITY): Troponin I (High Sensitivity): 4 ng/L (ref <18)

## 2022-08-13 LAB — TSH: TSH: 1.499 u[IU]/mL (ref 0.350–4.500)

## 2022-08-13 NOTE — ED Notes (Signed)
Pt left due to wait time  

## 2022-08-13 NOTE — Telephone Encounter (Signed)
Patient c/o Palpitations:  High priority if patient c/o lightheadedness, shortness of breath, or chest pain  How long have you had palpitations/irregular HR/ Afib? Are you having the symptoms now?   Yes  Are you currently experiencing lightheadedness, SOB or CP?   Yes  Do you have a history of afib (atrial fibrillation) or irregular heart rhythm? No  Have you checked your BP or HR? (document readings if available):   106/81 (20 minutes ago)  Are you experiencing any other symptoms? Clammy, sweating   Patient stated she has had these symptoms for the last 92 hours.

## 2022-08-13 NOTE — ED Triage Notes (Signed)
Pt. Stated, on Saturday night  I started to have palpitations with cold sweaty and SOB .  It was like this all day Sunday and this morning. My phone is saying A-Fib

## 2022-08-13 NOTE — Telephone Encounter (Signed)
Pt called to report that for about 32 hours she has been having a fats HR and it has been out of rhythm... she says that she has been dizzy, diaphoretic and clammy... she has been very SOB... her HR now is 165 and her apple watch says she is in Afib which is new for her. I have ask her to call EMS so they can assess her and put her on a monitor to capture what she is in. She agrees and I will follow back up with her this afternoon.

## 2022-08-13 NOTE — ED Provider Triage Note (Signed)
Emergency Medicine Provider Triage Evaluation Note  Savannah Dennis , a 51 y.o. female  was evaluated in triage.  Pt complains of atrial fibrillation..  She has a history of previous PFO with repair, history of PVCs.  She reports increased alcohol intake on Saturday, she had racing heart and her phone showed heart rate of 160 with atrial fibrillation on her iPhone and watch.  Patient has had intermittent racing heart and regular heartbeat but has had persistent atrial fibrillation.  She has no history of previous.  She denies chest pain or shortness of breath.  She took 4 aspirin prior to arrival.  EMS EKG does not show A-fib at this time.  Review of Systems  Positive: Racing heart Negative: cp  Physical Exam  BP (!) 148/95 (BP Location: Right Arm)   Pulse 83   Temp 98.1 F (36.7 C) (Oral)   Resp 18   SpO2 97%  Gen:   Awake, no distress   Resp:  Normal effort  MSK:   Moves extremities without difficulty  Other:  RRR  Medical Decision Making  Medically screening exam initiated at 9:51 AM.  Appropriate orders placed.  Savannah Dennis was informed that the remainder of the evaluation will be completed by another provider, this initial triage assessment does not replace that evaluation, and the importance of remaining in the ED until their evaluation is complete.  Work up initiated   Savannah Dennis, [place] 08/13/22 [phone]

## 2022-08-17 ENCOUNTER — Ambulatory Visit: Payer: BC Managed Care – PPO | Admitting: Physician Assistant

## 2022-08-17 ENCOUNTER — Ambulatory Visit: Payer: BC Managed Care – PPO | Attending: Physician Assistant | Admitting: Physician Assistant

## 2022-08-17 ENCOUNTER — Encounter: Payer: Self-pay | Admitting: Physician Assistant

## 2022-08-17 VITALS — BP 124/76 | HR 62 | Ht 66.0 in | Wt 222.0 lb

## 2022-08-17 DIAGNOSIS — I480 Paroxysmal atrial fibrillation: Secondary | ICD-10-CM | POA: Diagnosis not present

## 2022-08-17 DIAGNOSIS — G459 Transient cerebral ischemic attack, unspecified: Secondary | ICD-10-CM

## 2022-08-17 DIAGNOSIS — Q2112 Patent foramen ovale: Secondary | ICD-10-CM | POA: Diagnosis not present

## 2022-08-17 MED ORDER — APIXABAN 5 MG PO TABS: 5.0000 mg | ORAL_TABLET | Freq: Two times a day (BID) | ORAL | 2 refills | Status: DC

## 2022-08-17 NOTE — Patient Instructions (Signed)
Medication Instructions:  STOP Aspirin  START Eliquis (Apixaban) 5 mg 2 times a day. Samples of this drug were given to the patient, quantity 2 boxes , Lot Number  [id number] Expiration Jul 2025  *If you need a refill on your cardiac medications before your next appointment, please call your pharmacy*  Lab Work: NONE ordered at this time of appointment    If you have labs (blood work) drawn today and your tests are completely normal, you will receive your results only by: [place] (if you have MyChart) OR A paper copy in the mail If you have any lab test that is abnormal or we need to change your treatment, we will call you to review the results.  Testing/Procedures: Almyra Deforest, PA-C has requested that you have an echocardiogram. Echocardiography is a painless test that uses sound waves to create images of your heart. It provides your doctor with information about the size and shape of your heart and how well your heart's chambers and valves are working. This procedure takes approximately one hour. There are no restrictions for this procedure. Please do NOT wear cologne, perfume, aftershave, or lotions (deodorant is allowed). Please arrive 15 minutes prior to your appointment time.  Follow-Up: At [facility], you and your health needs are our priority.  As part of our continuing mission to provide you with exceptional heart care, we have created designated Provider Care Teams.  These Care Teams include your primary Cardiologist (physician) and Advanced Practice Providers (APPs -  Physician Assistants and Nurse Practitioners) who all work together to provide you with the care you need, when you need it.  Your next appointment:   3-4 month(s)  The format for your next appointment:   In Person  Provider:   Dorris Carnes, MD     Other Instructions  Important Information About Sugar
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
## 2022-08-22 ENCOUNTER — Ambulatory Visit (HOSPITAL_COMMUNITY): Payer: BC Managed Care – PPO | Attending: Physician Assistant

## 2022-08-22 DIAGNOSIS — I480 Paroxysmal atrial fibrillation: Secondary | ICD-10-CM | POA: Insufficient documentation

## 2022-08-22 LAB — ECHOCARDIOGRAM COMPLETE
Area-P 1/2: 5.62 cm2
S' Lateral: 2.9 cm

## 2022-08-23 ENCOUNTER — Other Ambulatory Visit: Payer: Self-pay | Admitting: Physician Assistant

## 2022-08-23 DIAGNOSIS — M542 Cervicalgia: Secondary | ICD-10-CM | POA: Diagnosis not present

## 2022-08-23 DIAGNOSIS — M1990 Unspecified osteoarthritis, unspecified site: Secondary | ICD-10-CM | POA: Diagnosis not present

## 2022-08-23 DIAGNOSIS — M549 Dorsalgia, unspecified: Secondary | ICD-10-CM | POA: Diagnosis not present

## 2022-08-23 DIAGNOSIS — R5383 Other fatigue: Secondary | ICD-10-CM | POA: Diagnosis not present

## 2022-08-23 MED ORDER — FLECAINIDE ACETATE 150 MG PO TABS: 300.0000 mg | ORAL_TABLET | ORAL | 0 refills | Status: DC | PRN

## 2022-08-23 NOTE — Progress Notes (Signed)
History of PFO closure, otherwise normal pumping function of heart, no significant valve issue. I have sent in a Rx for some as needed dose of flecainide in case she has recurrent afib.

## 2022-08-30 DIAGNOSIS — R079 Chest pain, unspecified: Secondary | ICD-10-CM | POA: Diagnosis not present

## 2022-09-03 DIAGNOSIS — R079 Chest pain, unspecified: Secondary | ICD-10-CM | POA: Diagnosis not present

## 2022-09-05 ENCOUNTER — Other Ambulatory Visit: Payer: Self-pay | Admitting: Pharmacist Clinician (PhC)/ Clinical Pharmacy Specialist

## 2022-09-05 DIAGNOSIS — M5459 Other low back pain: Secondary | ICD-10-CM | POA: Diagnosis not present

## 2022-09-05 DIAGNOSIS — M542 Cervicalgia: Secondary | ICD-10-CM | POA: Diagnosis not present

## 2022-09-05 MED ORDER — APIXABAN 5 MG PO TABS: 5.0000 mg | ORAL_TABLET | Freq: Two times a day (BID) | ORAL | 1 refills | Status: DC

## 2022-09-05 NOTE — Telephone Encounter (Signed)
Prescription refill request for Eliquis received. Indication: AF Last office visit:11/23 - Savannah Dennis  Scr: 0.77 Age: 51 Weight: 100.7 kg

## 2022-09-10 DIAGNOSIS — M5459 Other low back pain: Secondary | ICD-10-CM | POA: Diagnosis not present

## 2022-09-10 DIAGNOSIS — M542 Cervicalgia: Secondary | ICD-10-CM | POA: Diagnosis not present

## 2022-09-14 ENCOUNTER — Other Ambulatory Visit: Payer: Self-pay | Admitting: Physician Assistant

## 2022-09-18 DIAGNOSIS — R509 Fever, unspecified: Secondary | ICD-10-CM | POA: Diagnosis not present

## 2022-09-18 DIAGNOSIS — B349 Viral infection, unspecified: Secondary | ICD-10-CM | POA: Diagnosis not present

## 2022-09-18 DIAGNOSIS — Z20828 Contact with and (suspected) exposure to other viral communicable diseases: Secondary | ICD-10-CM | POA: Diagnosis not present

## 2022-09-21 DIAGNOSIS — J101 Influenza due to other identified influenza virus with other respiratory manifestations: Secondary | ICD-10-CM | POA: Diagnosis not present

## 2022-09-27 DIAGNOSIS — M5459 Other low back pain: Secondary | ICD-10-CM | POA: Diagnosis not present

## 2022-09-27 DIAGNOSIS — M542 Cervicalgia: Secondary | ICD-10-CM | POA: Diagnosis not present

## 2022-10-03 DIAGNOSIS — M5459 Other low back pain: Secondary | ICD-10-CM | POA: Diagnosis not present

## 2022-10-03 DIAGNOSIS — M542 Cervicalgia: Secondary | ICD-10-CM | POA: Diagnosis not present

## 2022-10-11 DIAGNOSIS — M542 Cervicalgia: Secondary | ICD-10-CM | POA: Diagnosis not present

## 2022-10-11 DIAGNOSIS — M5459 Other low back pain: Secondary | ICD-10-CM | POA: Diagnosis not present

## 2022-10-16 LAB — HM MAMMOGRAPHY

## 2022-10-30 ENCOUNTER — Telehealth: Payer: Self-pay | Admitting: *Deleted

## 2022-10-30 ENCOUNTER — Encounter: Payer: Self-pay | Admitting: *Deleted

## 2022-10-30 NOTE — Patient Instructions (Signed)
Visit Information  Thank you for taking time to visit with me today. Please don't hesitate to contact me if I can be of assistance to you.   Following are the goals we discussed today:   Goals Addressed             This Visit's Progress    COMPLETED: Care coordination activity       Care Coordination Interventions: Reviewed medications with patient and discussed adherence with no needed refills Reviewed scheduled/upcoming provider appointments including sufficient transportation source Screening for signs and symptoms of depression related to chronic disease state  Assessed social determinant of health barriers Educated on care management services utilizing social workers, Designer, jewellery and pharmacy with no presented needs at this time.         Please call the care guide team at [phone] if you need to cancel or reschedule your appointment.   If you are experiencing a Mental Health or [place] or need someone to talk to, please call the Suicide and Crisis Lifeline: 988  Patient verbalizes understanding of instructions and care plan provided today and agrees to view in [facility]. Active MyChart status and patient understanding of how to access instructions and care plan via MyChart confirmed with patient.     No further follow up required: No needs  Raina Mina, RN Care Management Coordinator [place] Office [phone]

## 2022-10-30 NOTE — Patient Outreach (Signed)
  Care Coordination   Initial Visit Note   10/30/2022 Name: Lawrie Tunks MRN: [id number] DOB: [birth year]  Hinata Diener is a 52 y.o. year old female who sees Merrilee Seashore, MD for primary care. I spoke with  [name] by phone today.  What matters to the patients health and wellness today?  No needs    Goals Addressed             This Visit's Progress    COMPLETED: Care coordination activity       Care Coordination Interventions: Reviewed medications with patient and discussed adherence with no needed refills Reviewed scheduled/upcoming provider appointments including sufficient transportation source Screening for signs and symptoms of depression related to chronic disease state  Assessed social determinant of health barriers Educated on care management services utilizing social workers, Designer, jewellery and pharmacy with no presented needs at this time.          SDOH assessments and interventions completed:  Yes  SDOH Interventions Today    Flowsheet Row Most Recent Value  SDOH Interventions   Food Insecurity Interventions Intervention Not Indicated  Housing Interventions Intervention Not Indicated  Transportation Interventions Intervention Not Indicated        Care Coordination Interventions:  Yes, provided   Follow up plan: No further intervention required.   Encounter Outcome:  Pt. Visit Completed   Raina Mina, RN Care Management Coordinator [place] Office [phone]
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
## 2022-11-30 ENCOUNTER — Encounter: Payer: Self-pay | Admitting: Internal Medicine

## 2022-11-30 ENCOUNTER — Ambulatory Visit: Payer: BLUE CROSS/BLUE SHIELD | Attending: Internal Medicine | Admitting: Internal Medicine

## 2022-11-30 VITALS — BP 130/80 | HR 60 | Ht 66.0 in | Wt 223.0 lb

## 2022-11-30 DIAGNOSIS — I480 Paroxysmal atrial fibrillation: Secondary | ICD-10-CM | POA: Diagnosis not present

## 2022-11-30 NOTE — Patient Instructions (Addendum)
Medication Instructions:   *If you need a refill on your cardiac medications before your next appointment, please call your pharmacy*   Lab Work: Cbc  If you have labs (blood work) drawn today and your tests are completely normal, you will receive your results only by: [place] (if you have MyChart) OR A paper copy in the mail If you have any lab test that is abnormal or we need to change your treatment, we will call you to review the results.   Testing/Procedures:    Follow-Up: At [facility], you and your health needs are our priority.  As part of our continuing mission to provide you with exceptional heart care, we have created designated Provider Care Teams.  These Care Teams include your primary Cardiologist (physician) and Advanced Practice Providers (APPs -  Physician Assistants and Nurse Practitioners) who all work together to provide you with the care you need, when you need it.  We recommend signing up for the patient portal called "MyChart".  Sign up information is provided on this After Visit Summary.  MyChart is used to connect with patients for Virtual Visits (Telemedicine).  Patients are able to view lab/test results, encounter notes, upcoming appointments, etc.  Non-urgent messages can be sent to your provider as well.   To learn more about what you can do with MyChart, go to [email or web address].    Your next appointment:   9 month(s)  Provider:   Dorris Carnes, MD     Other Instructions

## 2022-12-01 LAB — CBC
Hematocrit: 42.7 % (ref 34.0–46.6)
Hemoglobin: 14.1 g/dL (ref 11.1–15.9)
MCH: 30.3 pg (ref 26.6–33.0)
MCHC: 33 g/dL (ref 31.5–35.7)
MCV: 92 fL (ref 79–97)
Platelets: 263 10*3/uL (ref 150–450)
RBC: 4.66 x10E6/uL (ref 3.77–5.28)
RDW: 11.7 % (ref 11.7–15.4)
WBC: 7.8 10*3/uL (ref 3.4–10.8)

## 2023-01-15 ENCOUNTER — Other Ambulatory Visit: Payer: Self-pay | Admitting: Internal Medicine

## 2023-01-15 DIAGNOSIS — I4891 Unspecified atrial fibrillation: Secondary | ICD-10-CM

## 2023-01-16 NOTE — Telephone Encounter (Signed)
Prescription refill request for Eliquis received. Indication: a fib Last office visit: 11/30/22 Scr: 0.77 08/13/22 epic Age: 52 Weight: 101kg

## 2023-04-08 ENCOUNTER — Ambulatory Visit (HOSPITAL_BASED_OUTPATIENT_CLINIC_OR_DEPARTMENT_OTHER): Payer: BLUE CROSS/BLUE SHIELD | Admitting: Family Medicine

## 2023-04-08 ENCOUNTER — Encounter (HOSPITAL_BASED_OUTPATIENT_CLINIC_OR_DEPARTMENT_OTHER): Payer: Self-pay | Admitting: Family Medicine

## 2023-04-08 ENCOUNTER — Other Ambulatory Visit (HOSPITAL_BASED_OUTPATIENT_CLINIC_OR_DEPARTMENT_OTHER): Payer: Self-pay | Admitting: Family Medicine

## 2023-04-08 DIAGNOSIS — I4891 Unspecified atrial fibrillation: Secondary | ICD-10-CM | POA: Diagnosis not present

## 2023-04-08 DIAGNOSIS — M7711 Lateral epicondylitis, right elbow: Secondary | ICD-10-CM

## 2023-04-08 DIAGNOSIS — R809 Proteinuria, unspecified: Secondary | ICD-10-CM

## 2023-04-08 HISTORY — DX: Lateral epicondylitis, right elbow: M77.11

## 2023-04-08 NOTE — Patient Instructions (Signed)
  Medication Instructions:  Your physician recommends that you continue on your current medications as directed. Please refer to the Current Medication list given to you today. --If you need a refill on any your medications before your next appointment, please call your pharmacy first. If no refills are authorized on file call the office.-- Lab Work: Your physician has recommended that you have lab work today: Yes If you have labs (blood work) drawn today and your tests are completely normal, you will receive your results via MyChart message OR a phone call from our staff.  Please ensure you check your voicemail in the event that you authorized detailed messages to be left on a delegated number. If you have any lab test that is abnormal or we need to change your treatment, we will call you to review the results.  Referrals/Procedures/Imaging: No  Follow-Up: Your next appointment:   Your physician recommends that you schedule a follow-up appointment in: 2-3 months with Dr. de Cuba.  You will receive a text message or e-mail with a link to a survey about your care and experience with us today! We would greatly appreciate your feedback!   Thanks for letting us be apart of your health journey!!  Primary Care and Sports Medicine   Dr. Raymond de Cuba   We encourage you to activate your patient portal called "MyChart".  Sign up information is provided on this After Visit Summary.  MyChart is used to connect with patients for Virtual Visits (Telemedicine).  Patients are able to view lab/test results, encounter notes, upcoming appointments, etc.  Non-urgent messages can be sent to your provider as well. To learn more about what you can do with MyChart, please visit --  [email or web address].    
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
## 2023-04-09 LAB — BASIC METABOLIC PANEL
BUN/Creatinine Ratio: 18 (ref 9–23)
BUN: 12 mg/dL (ref 6–24)
CO2: 25 mmol/L (ref 20–29)
Calcium: 9.5 mg/dL (ref 8.7–10.2)
Chloride: 105 mmol/L (ref 96–106)
Creatinine, Ser: 0.65 mg/dL (ref 0.57–1.00)
Glucose: 92 mg/dL (ref 70–99)
Potassium: 5.1 mmol/L (ref 3.5–5.2)
Sodium: 142 mmol/L (ref 134–144)
eGFR: 105 mL/min/{1.73_m2} (ref >59)

## 2023-04-09 LAB — MICROALBUMIN / CREATININE URINE RATIO
Creatinine, Urine: 47.9 mg/dL
Microalb/Creat Ratio: <6 mg/g creat (ref 0–29)
Microalbumin, Urine: <3 ug/mL

## 2023-04-11 DIAGNOSIS — R809 Proteinuria, unspecified: Secondary | ICD-10-CM

## 2023-04-11 HISTORY — DX: Proteinuria, unspecified: R80.9

## 2023-04-11 NOTE — Assessment & Plan Note (Signed)
Paroxysmal in nature.  Patient continues with Eliquis for anticoagulation.  She also has flecainide available for use as needed.  No additional concerns today.

## 2023-04-11 NOTE — Assessment & Plan Note (Signed)
Reportedly found on labs in the past.  In conjunction with this, reported concern related to CKD based on blood test.  Reviewed prior labs with patient in the office today.  For the labs that we have available for direct review in EMR, creatinine has been normal and GFR has also been normal.  For some of these readings, EGFR is reported as greater than 60.  Discussed that no specific numbers given and this numeric indication is notably vague. Today, we can proceed with recheck of BMP as well as assessing for any significant protein in the urine if normal, likely can continue with intermittent monitoring without any further evaluation or treatment indicated at this time.  If abnormality identified, we will proceed accordingly

## 2023-04-11 NOTE — Assessment & Plan Note (Signed)
History and exam most consistent with lateral epicondylitis of right elbow.  We discussed recommendations pertaining to management.  Discussed proper use of elbow pad that patient has been utilizing.  Also discussed use of topical Voltaren gel.  Can consider further evaluation with physical therapy or occupational therapy, she wishes to hold off on this for now.  If symptoms do persist or worsen, can also consider use of wrist brace Discussed expectation for symptoms to gradually improve on their own.  In cases where symptoms do persist, can consider procedural intervention Discussed ergonomic changes which can help with pain control as well, particularly related to home and work environment.

## 2023-04-25 ENCOUNTER — Other Ambulatory Visit (HOSPITAL_BASED_OUTPATIENT_CLINIC_OR_DEPARTMENT_OTHER): Payer: Self-pay

## 2023-04-25 ENCOUNTER — Encounter (HOSPITAL_BASED_OUTPATIENT_CLINIC_OR_DEPARTMENT_OTHER): Payer: Self-pay | Admitting: Family Medicine

## 2023-04-25 DIAGNOSIS — Z0000 Encounter for general adult medical examination without abnormal findings: Secondary | ICD-10-CM

## 2023-04-30 LAB — BASIC METABOLIC PANEL
BUN/Creatinine Ratio: 14 (ref 9–23)
BUN: 12 mg/dL (ref 6–24)
CO2: 23 mmol/L (ref 20–29)
Calcium: 9.7 mg/dL (ref 8.7–10.2)
Chloride: 103 mmol/L (ref 96–106)
Creatinine, Ser: 0.84 mg/dL (ref 0.57–1.00)
Glucose: 84 mg/dL (ref 70–99)
Potassium: 5.1 mmol/L (ref 3.5–5.2)
Sodium: 141 mmol/L (ref 134–144)
eGFR: 83 mL/min/{1.73_m2} (ref >59)

## 2023-04-30 LAB — LIPID PANEL
Chol/HDL Ratio: 2.5 ratio (ref 0.0–4.4)
Cholesterol, Total: 168 mg/dL (ref 100–199)
HDL: 66 mg/dL (ref >39)
LDL Chol Calc (NIH): 90 mg/dL (ref 0–99)
Triglycerides: 62 mg/dL (ref 0–149)
VLDL Cholesterol Cal: 12 mg/dL (ref 5–40)

## 2023-04-30 LAB — HEMOGLOBIN A1C
Est. average glucose Bld gHb Est-mCnc: 105 mg/dL
Hgb A1c MFr Bld: 5.3 % (ref 4.8–5.6)

## 2023-05-15 ENCOUNTER — Ambulatory Visit (INDEPENDENT_AMBULATORY_CARE_PROVIDER_SITE_OTHER): Payer: 59 | Admitting: Family Medicine

## 2023-05-15 ENCOUNTER — Encounter (HOSPITAL_BASED_OUTPATIENT_CLINIC_OR_DEPARTMENT_OTHER): Payer: Self-pay | Admitting: Family Medicine

## 2023-05-15 VITALS — BP 139/82 | HR 62 | Temp 97.7°F | Ht 66.0 in | Wt 203.0 lb

## 2023-05-15 DIAGNOSIS — R1012 Left upper quadrant pain: Secondary | ICD-10-CM

## 2023-05-15 DIAGNOSIS — Z0000 Encounter for general adult medical examination without abnormal findings: Secondary | ICD-10-CM

## 2023-05-15 HISTORY — DX: Encounter for general adult medical examination without abnormal findings: Z00.00

## 2023-05-15 HISTORY — DX: Left upper quadrant pain: R10.12

## 2023-05-15 NOTE — Assessment & Plan Note (Signed)
Area of concern near left upper quadrant of abdomen/lower anterior chest wall extending laterally.  Uncertain etiology, given increased prominence, possible underlying mass, can proceed with initial ultrasound for assessment with plan to proceed with further imaging as indicated or referral to specialist
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
## 2023-05-15 NOTE — Assessment & Plan Note (Signed)
Routine HCM labs reviewed. HCM reviewed/discussed. Anticipatory guidance regarding healthy weight, lifestyle and choices given. Recommend healthy diet.  Recommend approximately 150 minutes/week of moderate intensity exercise Recommend regular dental and vision exams Always use seatbelt/lap and shoulder restraints Recommend using smoke alarms and checking batteries at least twice a year Recommend using sunscreen when outside Discussed colon cancer screening recommendations, options.  Patient UTD Discussed immunization recommendations - unsure if needing tetanus, did receive Shingles. Had mammo last year at [facility]

## 2023-05-21 ENCOUNTER — Ambulatory Visit
Admission: RE | Admit: 2023-05-21 | Discharge: 2023-05-21 | Disposition: A | Discharge status: Home / Self Care | Payer: 59 | Source: Ambulatory Visit | Attending: Family Medicine | Admitting: Family Medicine

## 2023-05-21 DIAGNOSIS — R1012 Left upper quadrant pain: Secondary | ICD-10-CM

## 2023-05-24 ENCOUNTER — Encounter (HOSPITAL_BASED_OUTPATIENT_CLINIC_OR_DEPARTMENT_OTHER): Payer: Self-pay | Admitting: Family Medicine

## 2023-05-28 ENCOUNTER — Other Ambulatory Visit (HOSPITAL_BASED_OUTPATIENT_CLINIC_OR_DEPARTMENT_OTHER): Payer: Self-pay | Admitting: Family Medicine

## 2023-05-28 DIAGNOSIS — R1902 Left upper quadrant abdominal swelling, mass and lump: Secondary | ICD-10-CM

## 2023-06-10 ENCOUNTER — Other Ambulatory Visit: Payer: Self-pay | Admitting: Internal Medicine

## 2023-06-10 DIAGNOSIS — I4891 Unspecified atrial fibrillation: Secondary | ICD-10-CM

## 2023-06-11 NOTE — Telephone Encounter (Signed)
Prescription refill request for Eliquis received. Indication:afib Last office visit:2/24 Scr:0.84  7/24 Age: 52 Weight:92.1  kg  Prescription refilled

## 2023-06-25 ENCOUNTER — Other Ambulatory Visit: Payer: Self-pay | Admitting: General Surgery

## 2023-06-25 DIAGNOSIS — R109 Unspecified abdominal pain: Secondary | ICD-10-CM

## 2023-06-25 DIAGNOSIS — R1012 Left upper quadrant pain: Secondary | ICD-10-CM

## 2023-06-25 HISTORY — DX: Unspecified abdominal pain: R10.9

## 2023-07-12 ENCOUNTER — Ambulatory Visit
Admission: RE | Admit: 2023-07-12 | Discharge: 2023-07-12 | Disposition: A | Discharge status: Home / Self Care | Payer: 59 | Source: Ambulatory Visit | Attending: General Surgery | Admitting: General Surgery

## 2023-07-12 DIAGNOSIS — R1012 Left upper quadrant pain: Secondary | ICD-10-CM

## 2023-07-12 DIAGNOSIS — R109 Unspecified abdominal pain: Secondary | ICD-10-CM

## 2023-07-12 MED ORDER — IOPAMIDOL (ISOVUE-300) INJECTION 61%
500.0000 mL | Freq: Once | INTRAVENOUS | Status: AC | PRN
  Administered 2023-07-12: 100 mL via INTRAVENOUS

## 2023-08-13 ENCOUNTER — Telehealth: Payer: Self-pay | Admitting: *Deleted

## 2023-08-13 ENCOUNTER — Encounter: Payer: Self-pay | Admitting: Internal Medicine

## 2023-08-13 ENCOUNTER — Ambulatory Visit: Payer: 59 | Attending: Internal Medicine | Admitting: Internal Medicine

## 2023-08-13 VITALS — BP 138/76 | HR 53 | Ht 66.0 in | Wt 207.6 lb

## 2023-08-13 DIAGNOSIS — I493 Ventricular premature depolarization: Secondary | ICD-10-CM

## 2023-08-13 DIAGNOSIS — R0683 Snoring: Secondary | ICD-10-CM | POA: Diagnosis not present

## 2023-08-13 MED ORDER — AMLODIPINE BESYLATE 2.5 MG PO TABS: 2.5000 mg | ORAL_TABLET | Freq: Every day | ORAL | 3 refills | Status: DC

## 2023-08-13 MED ORDER — RIVAROXABAN 20 MG PO TABS: 20.0000 mg | ORAL_TABLET | Freq: Every day | ORAL | 3 refills | Status: DC
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
## 2023-08-13 NOTE — Telephone Encounter (Signed)
DR. Darrelyn Hillock SLEEP STUDY.   Patient agreement reviewed and signed on 08/13/2023.  WatchPAT issued to patient on 08/13/2023 by Danielle Rankin, CMA. Patient aware to not open the WatchPAT box until contacted with the activation PIN. Patient profile initialized in CloudPAT on 08/13/2023 by Danielle Rankin, CMA. Device serial number: [id number]  Please list Reason for Call as Advice Only and type "WatchPAT issued to patient" in the comment box.

## 2023-08-13 NOTE — Patient Instructions (Addendum)
Medication Instructions:  Stop Eliquis Start Xarelto once day  Start Amlodipine 2.5 mg a day   *If you need a refill on your cardiac medications before your next appointment, please call your pharmacy*   Lab Work:  If you have labs (blood work) drawn today and your tests are completely normal, you will receive your results only by: MyChart Message (if you have MyChart) OR A paper copy in the mail If you have any lab test that is abnormal or we need to change your treatment, we will call you to review the results.   Testing/Procedures:  [place] Sleep Study   Follow-Up: 6 weeks with nurse for BP check with BP cuff and log   May 2025 with Dr Tenny Craw   At [facility], you and your health needs are our priority.  As part of our continuing mission to provide you with exceptional heart care, we have created designated Provider Care Teams.  These Care Teams include your primary Cardiologist (physician) and Advanced Practice Providers (APPs -  Physician Assistants and Nurse Practitioners) who all work together to provide you with the care you need, when you need it.  We recommend signing up for the patient portal called "MyChart".  Sign up information is provided on this After Visit Summary.  MyChart is used to connect with patients for Virtual Visits (Telemedicine).  Patients are able to view lab/test results, encounter notes, upcoming appointments, etc.  Non-urgent messages can be sent to your provider as well.   To learn more about what you can do with MyChart, go to [email or web address].

## 2023-08-13 NOTE — Progress Notes (Signed)
Stop Bang 4

## 2023-08-26 ENCOUNTER — Telehealth: Payer: Self-pay | Admitting: Internal Medicine

## 2023-08-26 NOTE — Telephone Encounter (Signed)
Ordering provider: DR ROSS Associated diagnoses: R06.83 WatchPAT PA obtained on 08/26/2023 by Latrelle Dodrill, CMA. Authorization: No; tracking ID NO PA REQ FOR HST Patient notified of PIN (1234) on 08/26/2023 via Notification Method: phone.

## 2023-08-26 NOTE — Telephone Encounter (Signed)
Patient notified of PIN (1234) on 08/26/2023 via Notification Method: phone.

## 2023-08-26 NOTE — Telephone Encounter (Signed)
Patient is calling about her sleep monitor and to see if she was approved by insurance to wear it

## 2023-08-27 ENCOUNTER — Encounter (INDEPENDENT_AMBULATORY_CARE_PROVIDER_SITE_OTHER): Payer: 59 | Admitting: Cardiology

## 2023-08-27 DIAGNOSIS — R0683 Snoring: Secondary | ICD-10-CM

## 2023-08-29 ENCOUNTER — Encounter: Payer: Self-pay | Admitting: Internal Medicine

## 2023-08-30 ENCOUNTER — Ambulatory Visit: Payer: 59 | Attending: Internal Medicine

## 2023-08-30 DIAGNOSIS — R0683 Snoring: Secondary | ICD-10-CM

## 2023-08-30 NOTE — Procedures (Signed)
   SLEEP STUDY REPORT Patient Information Study Date: 08/27/2023 Patient Name: Savannah Dennis Patient ID: [id number] Birth Date: Apr 04, 1971 Age: 52 Gender: Female BMI: 33.3 (W=207 lb, H=5' 6'') Stopbang: 4 Referring Physician: Dietrich Pates, MD  TEST DESCRIPTION: Home sleep apnea testing was completed using the WatchPat, a Type 1 device, utilizing  peripheral arterial tonometry (PAT), chest movement, actigraphy, pulse oximetry, pulse rate, body position and snore.  AHI was calculated with apnea and hypopnea using valid sleep time as the denominator. RDI includes apneas,  hypopneas, and RERAs. The data acquired and the scoring of sleep and all associated events were performed in  accordance with the recommended standards and specifications as outlined in the AASM Manual for the Scoring of  Sleep and Associated Events 2.2.0 (2015).   FINDINGS: 1. No evidence of Obstructive Sleep Apnea with AHI 3.1/hr.  2. No Central Sleep Apnea. 3. Oxygen desaturations as low as 89%. 4. Mild snoring was present. O2 sats were < 88% for 0 minutes. 5. Total sleep time was 7 hrs and 31 min. 6. 5.6% of total sleep time was spent in REM sleep.  7. Normal sleep onset latency at 22 min.  8. Prolonged REM sleep onset latency at 145 min.  9. Total awakenings were 12 .  10. Arrhythmias: Possible Atrial Fibrillation lasting 51 seconds. This is not diagnostic and further testing should be  considered if clinically indicated.  DIAGNOSIS:  Normal study with no significant sleep disordered breathing.  RECOMMENDATIONS: 1. Normal study with no significant sleep disordered breathing.  2. Healthy sleep recommendations include: adequate nightly sleep (normal 7-9 hrs/night), avoidance of caffeine after  noon and alcohol near bedtime, and maintaining a sleep environment that is cool, dark and quiet. 3. Weight loss for overweight patients is recommended.   4. Snoring recommendations include: weight loss where  appropriate, side sleeping, and avoidance of alcohol before  bed.  5. Operation of motor vehicle or dangerous equipment must be avoided when feeling drowsy, excessively sleepy, or  mentally fatigued.   6. An ENT consultation which may be useful for specific causes of and possible treatment of bothersome snoring .   7. Weight loss may be of benefit in reducing the severity of snoring.   Signature: Armanda Magic, MD; [facility]; Diplomat, American Board of Sleep  Medicine Electronically Signed: 08/30/2023 11:22:32 AM

## 2023-09-03 ENCOUNTER — Telehealth: Payer: Self-pay | Admitting: Internal Medicine

## 2023-09-03 NOTE — Telephone Encounter (Signed)
Pt is calling regarding her Sleep Study results. She stated she hadn't heard anything and is requesting a callback. Please advise

## 2023-09-06 ENCOUNTER — Telehealth: Payer: Self-pay | Admitting: *Deleted

## 2023-09-06 NOTE — Telephone Encounter (Signed)
The patient has been notified of the result and verbalized understanding.  All questions (if any) were answered. Latrelle Dodrill, CMA 09/06/2023 4:58 PM    Pt is aware and agreeable to normal results.

## 2023-09-06 NOTE — Telephone Encounter (Signed)
-----   Message from Armanda Magic sent at 08/30/2023 11:23 AM EST ----- Please let patient know that sleep study showed no significant sleep apnea.

## 2023-09-06 NOTE — Telephone Encounter (Signed)
The patient has been notified of the result and verbalized understanding.  All questions (if any) were answered. Latrelle Dodrill, CMA 09/06/2023 4:58 PM     Pt is aware and agreeable to normal results.

## 2023-09-17 ENCOUNTER — Other Ambulatory Visit: Payer: Self-pay

## 2023-09-17 MED ORDER — AMLODIPINE BESYLATE 2.5 MG PO TABS: 2.5000 mg | ORAL_TABLET | Freq: Every day | ORAL | 3 refills | Status: DC

## 2023-09-18 ENCOUNTER — Encounter: Payer: Self-pay | Admitting: Internal Medicine

## 2023-09-18 DIAGNOSIS — I480 Paroxysmal atrial fibrillation: Secondary | ICD-10-CM

## 2023-09-18 DIAGNOSIS — I493 Ventricular premature depolarization: Secondary | ICD-10-CM

## 2023-09-19 NOTE — Telephone Encounter (Signed)
Reviewed strip  SR    Heart rates are normal to vary as hers are    Symptoms not related to PFO closure I am not sure why BP jumped   I would recomm increasing amlodipine to 2.5 mg bid

## 2023-09-20 MED ORDER — AMLODIPINE BESYLATE 2.5 MG PO TABS: 2.5000 mg | ORAL_TABLET | Freq: Two times a day (BID) | ORAL | 3 refills | Status: DC

## 2023-09-27 ENCOUNTER — Ambulatory Visit: Payer: 59 | Attending: Cardiovascular Disease

## 2023-09-27 VITALS — BP 140/76 | HR 62 | Ht 66.0 in | Wt 215.0 lb

## 2023-09-27 DIAGNOSIS — I10 Essential (primary) hypertension: Secondary | ICD-10-CM

## 2023-09-27 MED ORDER — AMLODIPINE BESYLATE 2.5 MG PO TABS: 2.5000 mg | ORAL_TABLET | Freq: Two times a day (BID) | ORAL | 11 refills | Status: DC

## 2023-09-27 NOTE — Progress Notes (Signed)
   Nurse Visit   Date of Encounter: 09/27/2023 ID: Savannah Dennis, DOB 07/05/1971, MRN [id number]  PCP:  [place], Raymond J, MD    [facility] Providers Cardiologist:  Dietrich Pates, MD      Visit Details   VS:  BP (!) 140/76 (BP Location: Left Arm, Cuff Size: Normal)   Pulse 62   Ht 5\' 6"  (1.676 m)   Wt 215 lb (97.5 kg)   SpO2 100%   BMI 34.70 kg/m  , BMI Body mass index is 34.7 kg/m.  Wt Readings from Last 3 Encounters:  09/27/23 215 lb (97.5 kg)  08/13/23 207 lb 9.6 oz (94.2 kg)  05/15/23 203 lb (92.1 kg)     Reason for visit: BP check Performed today: Vitals, Provider consulted:Dr. Excell Seltzer, and Education Changes (medications, testing, etc.) : No change. Will have Dr. Tenny Craw review and make further changes. Patient also brought a copy of BP checks. Will have them scanned into patient's chart. Length of Visit: 10 minutes    Medications Adjustments/Labs and Tests Ordered: No orders of the defined types were placed in this encounter.  Meds ordered this encounter  Medications   amLODipine (NORVASC) 2.5 MG tablet    Sig: Take 1 tablet (2.5 mg total) by mouth in the morning and at bedtime.    Dispense:  60 tablet    Refill:  [id number] [place] Dennis, [place]  09/27/2023 4:06 PM

## 2023-09-27 NOTE — Patient Instructions (Signed)
Medication Instructions:  Your physician recommends that you continue on your current medications as directed. Please refer to the Current Medication list given to you today.  *If you need a refill on your cardiac medications before your next appointment, please call your pharmacy*  Lab Work: If you have labs (blood work) drawn today and your tests are completely normal, you will receive your results only by: MyChart Message (if you have MyChart) OR A paper copy in the mail If you have any lab test that is abnormal or we need to change your treatment, we will call you to review the results.  Testing/Procedures: None ordered today.  Follow-Up: At [facility], you and your health needs are our priority.  As part of our continuing mission to provide you with exceptional heart care, we have created designated Provider Care Teams.  These Care Teams include your primary Cardiologist (physician) and Advanced Practice Providers (APPs -  Physician Assistants and Nurse Practitioners) who all work together to provide you with the care you need, when you need it.  We recommend signing up for the patient portal called "MyChart".  Sign up information is provided on this After Visit Summary.  MyChart is used to connect with patients for Virtual Visits (Telemedicine).  Patients are able to view lab/test results, encounter notes, upcoming appointments, etc.  Non-urgent messages can be sent to your provider as well.   To learn more about what you can do with MyChart, go to [email or web address].    Your next appointment:   5 month(s)  Provider:   Dietrich Pates, MD     Other Instructions Diet & Lifestyle recommendations:  Physical activity recommendation (The Physical Activity Guidelines for Americans. JAMA 2018;Nov 12) At least 150-300 minutes a week of moderate-intensity, or 75-150 minutes a week of vigorous-intensity aerobic physical activity, or an equivalent combination of moderate- and  vigorous-intensity aerobic activity. Adults should perform muscle-strengthening activities on 2 or more days a week. Older adults should do multicomponent physical activity that includes balance training as well as aerobic and muscle-strengthening activities. Benefits of increased physical activity include lower risk of mortality including cardiovascular mortality, lower risk of cardiovascular events and associated risk factors (hypertension and diabetes), and lower risk of many cancers (including bladder, breast, colon, endometrium, esophagus, kidney, lung, and stomach). Additional improvments have been seen in cognition, risk of dementia, anxiety and depression, improved bone health, lower risk of falls, and associated injuries.  Dietary recommendation The 2019 ACC/AHA guidelines promote nutrition as a main fixture of cardiovascular wellness, with a recommendation for a varied diet of fruit, vegetables, fish, legumes, and whole grains (Class I), as well as recommendations to reduce sodium, cholesterol, processed meats, and refined sugars (Class IIa recommendation).10 Sodium intake, a topic of some controversy as of late, is recommended to be kept at 1,500 mg/day or less, far below the average daily intake in the [place] of 3,409 mg/day, and notably below that of previous [place] recommendations for 300mg /day.10,11 For those unable to reach 1,500 mg/day, they recommend at least a reduction of 1000 mg/day.  A Pesco-Mediterranean Diet With Intermittent Fasting: JACC Review Topic of the Week. J Am Coll Cardiol 2020;76:1484-1493 Pesco-Mediterranean diet, it is supplemented with extra-virgin olive oil (EVOO), which is the principle fat source, along with moderate amounts of dairy (particularly yogurt and cheese) and eggs, as well as modest amounts of alcohol consumption (ideally red wine with the evening meal), but few red and processed meats.

## 2023-10-03 ENCOUNTER — Telehealth: Payer: Self-pay | Admitting: Internal Medicine

## 2023-10-03 NOTE — Telephone Encounter (Signed)
Pt is requesting a callback from nurse Dewayne Hatch regarding MyChart conversation they were having. Pt stated once she sent info requested on the 16th she hadn't heard anything since. Please advise

## 2023-10-03 NOTE — Telephone Encounter (Signed)
Spoke with Pt. She was following up on the MyChart messages with Dewayne Hatch. Re-routed the messages to Dr Tenny Craw for review. Told pt we would reach out to her once Dr Tenny Craw had a chance to review those images.

## 2023-10-05 NOTE — Telephone Encounter (Signed)
Strip shows isolated PVCs     Heart rate reading probably not accurate, skips not picked up Set up for 72 hour Zio patch

## 2023-10-06 NOTE — Addendum Note (Signed)
Addended by: Bertram Millard on: 10/06/2023 09:57 PM   Modules accepted: Orders

## 2023-10-07 ENCOUNTER — Ambulatory Visit: Payer: 59 | Attending: Internal Medicine

## 2023-10-07 DIAGNOSIS — I493 Ventricular premature depolarization: Secondary | ICD-10-CM

## 2023-10-07 NOTE — Progress Notes (Unsigned)
Enrolled for [employer] to mail a ZIO XT long term holter monitor to the patients address on file.  

## 2023-10-11 DIAGNOSIS — I493 Ventricular premature depolarization: Secondary | ICD-10-CM | POA: Diagnosis not present

## 2023-10-28 ENCOUNTER — Encounter (HOSPITAL_BASED_OUTPATIENT_CLINIC_OR_DEPARTMENT_OTHER): Payer: Self-pay | Admitting: *Deleted

## 2023-10-29 ENCOUNTER — Other Ambulatory Visit: Payer: Self-pay

## 2023-10-29 DIAGNOSIS — Z79899 Other long term (current) drug therapy: Secondary | ICD-10-CM

## 2023-10-29 DIAGNOSIS — I10 Essential (primary) hypertension: Secondary | ICD-10-CM

## 2023-10-29 LAB — HM MAMMOGRAPHY

## 2023-10-29 MED ORDER — TRIAMTERENE-HCTZ 37.5-25 MG PO TABS: 0.5000 | ORAL_TABLET | Freq: Every day | ORAL | 2 refills | Status: DC

## 2023-11-07 NOTE — Telephone Encounter (Signed)
Review--  Overall monitor shows nothing alarming  No afib Short bursts of SVT are common to many, not a precurser to afib  Very brief Triggered events corresponded to SR with isolated skips only    I wouldn't recomm any changes    Average HR controlled    Stay hydrated

## 2023-11-11 ENCOUNTER — Ambulatory Visit: Payer: 59 | Admitting: Internal Medicine

## 2023-11-11 NOTE — Telephone Encounter (Signed)
Increase amlodipineto 10 mg daily and maxzide to 1 tab daily

## 2023-11-12 ENCOUNTER — Other Ambulatory Visit: Payer: Self-pay

## 2023-11-12 MED ORDER — AMLODIPINE BESYLATE 2.5 MG PO TABS: 2.5000 mg | ORAL_TABLET | Freq: Two times a day (BID) | ORAL | 3 refills | Status: DC

## 2023-11-12 MED ORDER — TRIAMTERENE-HCTZ 37.5-25 MG PO TABS: 1.0000 | ORAL_TABLET | Freq: Every day | ORAL | 2 refills | Status: DC

## 2023-11-12 MED ORDER — TRIAMTERENE-HCTZ 37.5-25 MG PO TABS: 1.0000 | ORAL_TABLET | Freq: Every day | ORAL | 3 refills | Status: DC

## 2023-11-12 MED ORDER — AMLODIPINE BESYLATE 10 MG PO TABS: 10.0000 mg | ORAL_TABLET | Freq: Every day | ORAL | 3 refills | Status: DC

## 2023-11-12 NOTE — Addendum Note (Signed)
Addended by: Bertram Millard on: 11/12/2023 09:43 AM   Modules accepted: Orders

## 2023-11-12 NOTE — Addendum Note (Signed)
Addended by: Bertram Millard on: 11/12/2023 08:33 AM   Modules accepted: Orders

## 2023-11-12 NOTE — Telephone Encounter (Signed)
Pt says she has not been taking the Amlodipine..she will start the 2.5 mg BID back and increase her Maxzide to one whole tablet.   Pt asking when to have her kidneys rechecked.. she says she had "kidney issues" in the past.

## 2023-11-13 NOTE — Addendum Note (Signed)
Addended by: Bertram Millard on: 11/13/2023 07:46 AM   Modules accepted: Orders

## 2023-11-18 ENCOUNTER — Ambulatory Visit (HOSPITAL_BASED_OUTPATIENT_CLINIC_OR_DEPARTMENT_OTHER): Payer: 59 | Admitting: Family Medicine

## 2023-11-28 ENCOUNTER — Encounter: Payer: Self-pay | Admitting: Cardiovascular Disease

## 2023-11-28 LAB — BASIC METABOLIC PANEL
BUN/Creatinine Ratio: 15 (ref 9–23)
BUN: 12 mg/dL (ref 6–24)
CO2: 22 mmol/L (ref 20–29)
Calcium: 9.5 mg/dL (ref 8.7–10.2)
Chloride: 104 mmol/L (ref 96–106)
Creatinine, Ser: 0.82 mg/dL (ref 0.57–1.00)
Glucose: 89 mg/dL (ref 70–99)
Potassium: 4.9 mmol/L (ref 3.5–5.2)
Sodium: 145 mmol/L — ABNORMAL HIGH (ref 134–144)
eGFR: 85 mL/min/{1.73_m2} (ref >59)

## 2023-12-23 ENCOUNTER — Encounter: Payer: Self-pay | Admitting: Internal Medicine

## 2023-12-24 ENCOUNTER — Encounter (HOSPITAL_BASED_OUTPATIENT_CLINIC_OR_DEPARTMENT_OTHER): Payer: Self-pay | Admitting: *Deleted

## 2023-12-30 ENCOUNTER — Encounter: Payer: Self-pay | Admitting: Cardiology

## 2023-12-30 DIAGNOSIS — J302 Other seasonal allergic rhinitis: Secondary | ICD-10-CM | POA: Insufficient documentation

## 2023-12-30 DIAGNOSIS — R5383 Other fatigue: Secondary | ICD-10-CM | POA: Insufficient documentation

## 2023-12-30 DIAGNOSIS — F419 Anxiety disorder, unspecified: Secondary | ICD-10-CM | POA: Insufficient documentation

## 2023-12-30 DIAGNOSIS — Z9109 Other allergy status, other than to drugs and biological substances: Secondary | ICD-10-CM | POA: Insufficient documentation

## 2023-12-31 ENCOUNTER — Encounter: Payer: Self-pay | Admitting: Cardiology

## 2023-12-31 ENCOUNTER — Ambulatory Visit: Payer: Self-pay | Attending: Cardiology | Admitting: Cardiology

## 2023-12-31 VITALS — BP 124/86 | HR 69 | Ht 66.0 in | Wt 225.4 lb

## 2023-12-31 DIAGNOSIS — I493 Ventricular premature depolarization: Secondary | ICD-10-CM

## 2023-12-31 DIAGNOSIS — I639 Cerebral infarction, unspecified: Secondary | ICD-10-CM

## 2023-12-31 DIAGNOSIS — I480 Paroxysmal atrial fibrillation: Secondary | ICD-10-CM

## 2023-12-31 DIAGNOSIS — D6859 Other primary thrombophilia: Secondary | ICD-10-CM

## 2023-12-31 DIAGNOSIS — Z9109 Other allergy status, other than to drugs and biological substances: Secondary | ICD-10-CM

## 2023-12-31 DIAGNOSIS — J302 Other seasonal allergic rhinitis: Secondary | ICD-10-CM

## 2023-12-31 DIAGNOSIS — R0602 Shortness of breath: Secondary | ICD-10-CM

## 2023-12-31 DIAGNOSIS — I10 Essential (primary) hypertension: Secondary | ICD-10-CM

## 2023-12-31 DIAGNOSIS — F419 Anxiety disorder, unspecified: Secondary | ICD-10-CM

## 2023-12-31 DIAGNOSIS — Q2112 Patent foramen ovale: Secondary | ICD-10-CM | POA: Diagnosis not present

## 2023-12-31 DIAGNOSIS — E669 Obesity, unspecified: Secondary | ICD-10-CM

## 2023-12-31 DIAGNOSIS — R008 Other abnormalities of heart beat: Secondary | ICD-10-CM

## 2023-12-31 DIAGNOSIS — I498 Other specified cardiac arrhythmias: Secondary | ICD-10-CM

## 2023-12-31 DIAGNOSIS — R42 Dizziness and giddiness: Secondary | ICD-10-CM

## 2023-12-31 NOTE — Patient Instructions (Signed)
 Medication Instructions:  Your physician recommends that you continue on your current medications as directed. Please refer to the Current Medication list given to you today.  *If you need a refill on your cardiac medications before your next appointment, please call your pharmacy*   Lab Work: NONE If you have labs (blood work) drawn today and your tests are completely normal, you will receive your results only by: MyChart Message (if you have MyChart) OR A paper copy in the mail If you have any lab test that is abnormal or we need to change your treatment, we will call you to review the results.   Testing/Procedures: NONE   Follow-Up: At [facility], you and your health needs are our priority.  As part of our continuing mission to provide you with exceptional heart care, we have created designated Provider Care Teams.  These Care Teams include your primary Cardiologist (physician) and Advanced Practice Providers (APPs -  Physician Assistants and Nurse Practitioners) who all work together to provide you with the care you need, when you need it.  We recommend signing up for the patient portal called "MyChart".  Sign up information is provided on this After Visit Summary.  MyChart is used to connect with patients for Virtual Visits (Telemedicine).  Patients are able to view lab/test results, encounter notes, upcoming appointments, etc.  Non-urgent messages can be sent to your provider as well.   To learn more about what you can do with MyChart, go to [email or web address].    Your next appointment:   6 month(s)  Provider:   Norman Herrlich, MD    Other Instructions

## 2023-12-31 NOTE — Progress Notes (Signed)
 Cardiology Office Note:  .   Date:  12/31/2023  ID:  Savannah Dennis, DOB [birth year], MRN [id number] PCP: Savannah Givens, MD  [facility] Providers Cardiologist:  Savannah Pates, MD    History of Present Illness: Savannah Dennis Kitchen   Savannah Dennis is a 53 y.o. female with a past medical history of atrial fibrillation on Xarelto, cryptogenic stroke, PFO s/p closure, history of tobacco abuse, hypertension.  10/24/2023 monitor average heart rate 74 bpm, 70 episodes of SVT 12/29/2021 monitor average heart rate 61 bpm, rare PACs and PVCs 01/28/2020 echo bubble study negative bubble study s/p PFO closure 12/04/2018 PFO closure 18 mm Amplatzer PFO occluder  09/23/2018 calcium score 0  History of cryptogenic TIA 2019. Evaluated by Dr. Pearlean Dennis in 2019 >> transcranial doppler revealed large R>L shunt with rest and valsalva. She underwent PFO closure on 12/04/2018. Evaluated in the ED in 2023 for atrial fibrillation.   Most recently evaluated by Savannah Dennis on 08/13/2023, sleep eval was arranged--no evidence of sleep apnea. Stable from a cardiac perspective.   She presents today to establish care in [place].  She has been doing well since she was last evaluated by Savannah Dennis.  She does not offer any formal complaints.  She had some questions as to why she was started on medication for her blood pressure as she felt that her blood pressure was typically well-controlled.  She has been keeping a log of her blood pressure, they are mostly well-controlled at home.  She would prefer to be off of her antihypertensive medications if possible.  She is tolerating her Xarelto without any hematochezia, hematuria, mopped assist.  She has not had any episodes of atrial fibrillation-as she has flecainide pill in the pocket as needed and has not needed to use it. She denies chest pain, palpitations, dyspnea, pnd, orthopnea, n, v, dizziness, syncope, edema, weight gain, or early satiety.   ROS: Review of Systems  Musculoskeletal:   Positive for back pain and joint pain.  All other systems reviewed and are negative.    Studies Reviewed: Savannah Dennis Kitchen   EKG Interpretation Date/Time:  Tuesday December 31 2023 15:48:03 EDT Ventricular Rate:  69 PR Interval:  190 QRS Duration:  82 QT Interval:  404 QTC Calculation: 432 R Axis:   93  Text Interpretation: Normal sinus rhythm Rightward axis Low voltage QRS When compared with ECG of 13-Aug-2023 16:25, No significant change was found Confirmed by Savannah Dennis [phone]) on 12/31/2023 3:53:34 PM    Cardiac Studies & Procedures   ______________________________________________________________________________________________ CARDIAC CATHETERIZATION  CARDIAC CATHETERIZATION 12/04/2018  Narrative Successful transcatheter PFO closure with an 18 mm Amplatzer PFO occluder device, guided by intracardiac echo and fluoroscopy   STRESS TESTS  ECHOCARDIOGRAM STRESS TEST 10/02/2018  Narrative *[facility]* [address] [place], [place] [address] [phone]  ------------------------------------------------------------------- Stress Echocardiography  Patient:    Savannah Dennis MR #:       [id number] Study Date: 10/02/2018 Gender:     F Age:        47 Height:     167.6 cm Weight:     103.6 kg BSA:        2.24 m^2 Pt. Status: Room:  SONOGRAPHER  Savannah Dennis, RCS PERFORMING   [facility], Outpatient ATTENDING    Savannah Dennis, Savannah Dennis, [place]  cc: Savannah Dennis  -------------------------------------------------------------------  ------------------------------------------------------------------- Indications:      Chest Pain (R07.9).  ------------------------------------------------------------------- History:  PMH:  Dizziness. Acquired from the patient and from the patient&'s chart.  Chest pain.  Dyspnea.  ------------------------------------------------------------------- Study Conclusions  - Stress ECG conclusions: The  stress ECG was normal. - Staged echo: There was no echocardiographic evidence for stress-induced ischemia.  Impressions:  - Stress echo with no chest pain; no diagnostic ST changes; 3 beats NSVT in recovery; no stress-induced wall motion abnormalities; normal study.  ------------------------------------------------------------------- Study data:   Study status:  Routine.  Consent:  The risks, benefits, and alternatives to the procedure were explained to the patient and informed consent was obtained.  Procedure:  The patient reported no pain pre or post test. Initial setup. The patient was brought to the laboratory. A baseline ECG was recorded. Surface ECG leads and automatic cuff blood pressure measurements were monitored. Treadmill exercise testing was performed using the Bruce protocol. The patient exercised for 8 min, to protocol stage 2, to a maximal work rate of 10.1 mets. Exercise was terminated due to achievement of target heart rate. The patient was positioned for image acquisition and recovery monitoring. Transthoracic stress echocardiography for chest pain evaluation. Image quality was adequate. Images were captured at baseline and peak exercise. Study completion:  The patient tolerated the procedure well. There were no complications.          Bruce protocol. Stress echocardiography.  Birthdate:  Patient birthdate: 08-03-1971.  Age: Patient is 53 yr old.  Sex:  Gender: female.    BMI: 36.9 kg/m^2. Blood pressure:     133/81  Patient status:  Outpatient.  Study date:  Study date: 10/02/2018. Study time: 02:10 PM.  -------------------------------------------------------------------  ------------------------------------------------------------------- Baseline ECG:  Normal sinus rhythm, RAD.  ------------------------------------------------------------------- Stress protocol:  +-------------+---+------------+----------------+--------+--------+ !Stage        !HR !BP  (mmHg)   !Rhythm          !Symptoms!Comments! +-------------+---+------------+----------------+--------+--------+ !Baseline     !71 !128/79 (95) !----------------!None    !--------! +-------------+---+------------+----------------+--------+--------+ !Stage 1      !107!150/67 (95) !----------------!--------!--------! +-------------+---+------------+----------------+--------+--------+ !Stage 2      !132!440/10 (110)!Occasional PVC&'s!Fatigue !RPE= 17.! +-------------+---+------------+----------------+--------+--------+ !Immediate    !160!------------!----------------!--------!--------! !post stress  !   !            !                !        !        ! +-------------+---+------------+----------------+--------+--------+ !Recovery; 1  !272!536/64 (105)!----------------!--------!--------! !min          !   !            !                !        !        ! +-------------+---+------------+----------------+--------+--------+ !Recovery; 2  !110!------------!----------------!--------!PAC&'s.  ! !min          !   !            !                !        !        ! +-------------+---+------------+----------------+--------+--------+ !Recovery; 3  !101!135/64 (88) !----------------!--------!--------! !min          !   !            !                !        !        ! +-------------+---+------------+----------------+--------+--------+ !  Recovery; 4  !97 !------------!----------------!--------!--------! !min          !   !            !                !        !        ! +-------------+---+------------+----------------+--------+--------+ !Recovery; 5  !96 !------------!----------------!--------!--------! !min          !   !            !                !        !        ! +-------------+---+------------+----------------+--------+--------+ !Late recovery!97 !116/61 (79)  !----------------!--------!--------! +-------------+---+------------+----------------+--------+--------+  ------------------------------------------------------------------- Stress results:   Maximal heart rate during stress was 160 bpm (94% of maximal predicted heart rate). The maximal predicted heart rate was 171 bpm.The target heart rate was achieved. The heart rate response to stress was normal. There was a normal resting blood pressure with an appropriate response to stress. The rate-pressure product for the peak heart rate and blood pressure was [address] mm Hg/min.  The patient experienced no chest pain during stress.  ------------------------------------------------------------------- Stress ECG:  3 beats NSVT in recovery.  Isolated ventricular ectopy.  The stress ECG was normal.  ------------------------------------------------------------------- Baseline:  - LV size was normal. - LV global systolic function was normal. The estimated LV ejection fraction was 60%. - Normal wall motion; no LV regional wall motion abnormalities.  Peak stress:  - LV size was reduced appropriately. - LV global systolic function was vigorous. The estimated LV ejection fraction was 70%. - No evidence for new LV regional wall motion abnormalities.  ------------------------------------------------------------------- Stress echo results:     Left ventricular ejection fraction was normal at rest and with stress. There was no echocardiographic evidence for stress-induced ischemia.  ------------------------------------------------------------------- Prepared and Electronically Authenticated by  Olga Millers 2019-12-19T15:29:30   ECHOCARDIOGRAM  ECHOCARDIOGRAM COMPLETE 08/22/2022  Narrative ECHOCARDIOGRAM REPORT    Patient Name:   Savannah Dennis Date of Exam: 08/22/2022 Medical Rec #:  [id number]       Height:       66.0 in Accession #:    [id number]      Weight:       222.0 lb Date of  Birth:  Mar 22, 1971       BSA:          2.091 m Patient Age:    51 years        BP:           124/76 mmHg Patient Gender: F               HR:           54 bpm. Exam Location:  [address]  Procedure: 2D Echo, Cardiac Doppler and Color Doppler  MODIFIED REPORT: This report was modified by Olga Millers MD on 08/22/2022 due to Change. Indications:     I48.0 Paroxysmal atrial fibrillation  History:         Patient has prior history of Echocardiogram examinations, most recent 01/28/2020. Arrythmias:PVC; Signs/Symptoms:Chest Pain and Shortness of Breath. PFO. S/P PFO closure. Obesity.  Sonographer:     Cathie Beams RCS Referring Phys:  [id number] HAO MENG Diagnosing Phys: Olga Millers MD  IMPRESSIONS   1. S/P PFO closure (Amplatzer occluder device) with no residual shunt. 2. Left ventricular ejection fraction, by estimation, is 60  to 65%. The left ventricle has normal function. The left ventricle has no regional wall motion abnormalities. There is mild concentric left ventricular hypertrophy. Left ventricular diastolic parameters were normal. 3. Right ventricular systolic function is normal. The right ventricular size is normal. 4. The mitral valve is normal in structure. Trivial mitral valve regurgitation. No evidence of mitral stenosis. 5. The aortic valve is tricuspid. Aortic valve regurgitation is not visualized. Aortic valve sclerosis is present, with no evidence of aortic valve stenosis. 6. The inferior vena cava is normal in size with greater than 50% respiratory variability, suggesting right atrial pressure of 3 mmHg.  FINDINGS Left Ventricle: Left ventricular ejection fraction, by estimation, is 60 to 65%. The left ventricle has normal function. The left ventricle has no regional wall motion abnormalities. The left ventricular internal cavity size was normal in size. There is mild concentric left ventricular hypertrophy. Left ventricular diastolic parameters were normal.  Right  Ventricle: The right ventricular size is normal. Right ventricular systolic function is normal.  Left Atrium: Left atrial size was normal in size.  Right Atrium: Right atrial size was normal in size.  Pericardium: There is no evidence of pericardial effusion.  Mitral Valve: The mitral valve is normal in structure. Trivial mitral valve regurgitation. No evidence of mitral valve stenosis.  Tricuspid Valve: The tricuspid valve is normal in structure. Tricuspid valve regurgitation is trivial. No evidence of tricuspid stenosis.  Aortic Valve: The aortic valve is tricuspid. Aortic valve regurgitation is not visualized. Aortic valve sclerosis is present, with no evidence of aortic valve stenosis.  Pulmonic Valve: The pulmonic valve was normal in structure. Pulmonic valve regurgitation is not visualized. No evidence of pulmonic stenosis.  Aorta: The aortic root is normal in size and structure.  Venous: The inferior vena cava is normal in size with greater than 50% respiratory variability, suggesting right atrial pressure of 3 mmHg.  IAS/Shunts: No atrial level shunt detected by color flow Doppler.  Additional Comments: S/P PFO closure (Amplatzer occluder device) with no residual shunt.   LEFT VENTRICLE PLAX 2D LVIDd:         4.50 cm   Diastology LVIDs:         2.90 cm   LV e' medial:    11.40 cm/s LV PW:         1.20 cm   LV E/e' medial:  6.5 LV IVS:        1.00 cm   LV e' lateral:   12.50 cm/s LVOT diam:     2.30 cm   LV E/e' lateral: 5.9 LV SV:         95 LV SV Index:   45 LVOT Area:     4.15 cm   RIGHT VENTRICLE RV Basal diam:  3.20 cm RV S prime:     14.10 cm/s TAPSE (M-mode): 2.5 cm  LEFT ATRIUM             Index        RIGHT ATRIUM           Index LA diam:        3.80 cm 1.82 cm/m   RA Area:     14.40 cm LA Vol (A2C):   41.3 ml 19.76 ml/m  RA Volume:   36.00 ml  17.22 ml/m LA Vol (A4C):   39.7 ml 18.99 ml/m LA Biplane Vol: 41.1 ml 19.66 ml/m AORTIC VALVE LVOT  Vmax:   108.00 cm/s LVOT Vmean:  65.800 cm/s LVOT VTI:  0.228 m  AORTA Ao Root diam: 3.10 cm Ao Asc diam:  3.30 cm  MITRAL VALVE MV Area (PHT): 5.62 cm    SHUNTS MV Decel Time: 135 msec    Systemic VTI:  0.23 m MV E velocity: 73.90 cm/s  Systemic Diam: 2.30 cm MV A velocity: 91.70 cm/s MV E/A ratio:  0.81  Olga Millers MD Electronically signed by Olga Millers MD Signature Date/Time: 08/22/2022/4:59:49 PM    Final (Updated)   TEE  ECHO TEE 10/30/2018  Narrative *[place]* *[facility]* [address] [place], [place] [address] [phone]  ------------------------------------------------------------------- Transesophageal Echocardiography  Patient:    Savannah Dennis, Savannah Dennis MR #:       [id number] Study Date: 10/30/2018 Gender:     F Age:        47 Height:     167.6 cm Weight:     97.3 kg BSA:        2.17 m^2 Pt. Status: Room:  SONOGRAPHER  Celene Skeen, RDCS ADMITTING    Weston Brass ATTENDING    Weston Brass Kieth Brightly, Gayatri PERFORMING   Weston Brass Vernie Ammons, Lynda Rainwater  cc:  ------------------------------------------------------------------- LV EF: 60% -   65%  ------------------------------------------------------------------- History:   PMH:  Hx of Bigeminy. stroke 434.91.  ------------------------------------------------------------------- Study Conclusions  - Left ventricle: Systolic function was normal. The estimated ejection fraction was in the range of 60% to 65%. 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 There was trivial regurgitation. - Atrial septum: There was a patent foramen ovale with bidirectional flow. Transthoracic images were also obtained prior to sedation. With appropriate Valsalva release, a large amount of bubbles were seen in the left heart, suggesting patent foramen ovale.  Impressions:  - Patent foramen ovale with R-L shunt by  agitated saline injection, at rest, and more prominently with Valsalva release.  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 If clinically indicated, consider further evaluation of mitral annular disjunction in the setting of patient&'s reported PVCs.  ------------------------------------------------------------------- Study data:   Study status:  Routine.  Consent:  The risks, benefits, and alternatives to the procedure were explained to the patient and informed consent was obtained.  Procedure:  The patient reported no pain pre or post test. Initial setup. The patient was brought to the laboratory. Surface ECG leads were monitored. Sedation. Conscious sedation was administered by cardiology staff. Transesophageal echocardiography. Topical anesthesia was obtained using viscous lidocaine. A transesophageal probe was inserted by the attending cardiologistwithout difficulty. Image quality was adequate.  Study completion:  The patient tolerated the procedure well. There were no complications.          Diagnostic transesophageal echocardiography.  2D and color Doppler. Birthdate:  Patient birthdate: September 29, 1971.  Age:  Patient is 53 yr old.  Sex:  Gender: female.    BMI: 34.6 kg/m^2.  Blood pressure: 111/76  Study date:  Study date: 10/30/2018. Study time: 09:19 AM.  Location:  Endoscopy.  -------------------------------------------------------------------  ------------------------------------------------------------------- Left ventricle:  Systolic function was normal. The estimated ejection fraction was in the range of 60% to 65%.  ------------------------------------------------------------------- Aortic valve:   Structurally normal valve.   Cusp separation was normal.  Doppler:  There was trivial regurgitation.  ------------------------------------------------------------------- Aorta:  The aorta was normal, not dilated, and  non-diseased.  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  Doppler:  There was trivial regurgitation.  ------------------------------------------------------------------- Left atrium:  The atrium was  normal in size.  No evidence of thrombus in the atrial cavity or appendage.  ------------------------------------------------------------------- Atrial septum:  There was a patent foramen ovale with bidirectional flow.  ------------------------------------------------------------------- Right ventricle:  The cavity size was normal. Wall thickness was normal. Systolic function was normal.  ------------------------------------------------------------------- Pulmonic valve:    Structurally normal valve.   Cusp separation was normal.  No evidence of vegetation.  Doppler:  There was trivial regurgitation.  ------------------------------------------------------------------- Tricuspid valve:   Structurally normal valve.   Leaflet separation was normal.  No evidence of vegetation.  Doppler:  There was trivial regurgitation.  ------------------------------------------------------------------- Pulmonary artery:   The main pulmonary artery was normal-sized.  ------------------------------------------------------------------- Right atrium:  The atrium was normal in size.  No evidence of thrombus in the atrial cavity or appendage.  ------------------------------------------------------------------- Pericardium:  The pericardium was normal in appearance. There was no pericardial effusion.  ------------------------------------------------------------------- Post procedure conclusions Ascending Aorta:  - The aorta was normal, not dilated, and non-diseased.  ------------------------------------------------------------------- Measurements  Aorta                                Value     Reference Aortic root ID, ED                   33.64 mm --------- Ascending aorta ID, A-P, mid, ED     32.83 mm 21 - 34  Legend: (L)  and  (H)  mark values outside specified reference range.  ------------------------------------------------------------------- Prepared and Electronically Authenticated by  Weston Brass 2020-01-16T16:18:20  MONITORS  LONG TERM MONITOR (3-14 DAYS) 10/23/2023  Narrative Patch Wear Time:  6 days and 13 hours (2024-12-27T14:35:33-0500 to 2025-01-03T03:55:33-0500)  Impression:   Sinus rhythm  Rates 52 to 125 bpm  Average HR 74 bpm Rare PAC, PVC  70 episodes of SVT, fastest for 8 beats at 160 bpm; longest for 17 sec at 101 bpm Diary entries correlated with SR with PAC   CT SCANS  CT CARDIAC SCORING (SELF PAY ONLY) 09/23/2018  Addendum 09/23/2018  4:18 PM ADDENDUM REPORT: 09/23/2018 16:16  CLINICAL DATA:  Risk stratification  EXAM: Coronary Calcium Score  TECHNIQUE: The patient was scanned on a Siemens Somatom 64 slice scanner. Axial non-contrast 3 mm slices were carried out through the heart. The data set was analyzed on a dedicated work station and scored using the Agatson method.  FINDINGS: Non-cardiac: See separate report from [facility].  Ascending aorta: Normal diameter 3.2 cm  Pericardium: Normal  Coronary arteries: No calcium noted  IMPRESSION: Coronary calcium score of 0.  Charlton Haws   Electronically Signed By: Charlton Haws M.D. On: 09/23/2018 16:16  Narrative EXAM: OVER-READ INTERPRETATION  CT CHEST  The following report is an over-read performed by radiologist Dr. Charlett Nose of [facility] on 09/23/2018. This over-read does not include interpretation of cardiac or coronary anatomy or pathology. The coronary calcium score interpretation by the cardiologist is attached.  COMPARISON:  None.  FINDINGS: Vascular: Heart is normal size.  Visualized aorta is normal  caliber.  Mediastinum/Nodes: No adenopathy in the lower mediastinum or hila.  Lungs/Pleura: Visualized lungs clear.  No effusions.  Upper Abdomen: Imaging into the upper abdomen shows no acute findings.  Musculoskeletal: Chest wall soft tissues are unremarkable. No acute bony abnormality.  IMPRESSION: No acute or significant extracardiac abnormality.  Electronically Signed: By: Charlett Nose M.D. On: 09/23/2018 13:56   CARDIAC MRI  MR CARDIAC  MORPHOLOGY W WO CONTRAST 12/29/2009  Narrative Clinical Data: PVCs, assess for evidence of ARVC  MR CARDIA MORPHOLOGY WITHOUT AND WITH CONTRAST  GE 1.5 T magnet with dedicated cardiac coil.  FIESTA sequences for function and morphology.  T1 and T1 fat sat sequences for tissue characterization.  10 minutes after injection of 30 mL Magnevist contrast, inversion recovery sequences were done for delayed enhancement assessment.  Contrast: 30 mL Magnevist  Comparison: None.  Findings: The left ventricle was normal in size and wall thickness. There were no regional wall motion abnormalities. EF could not be quantitatively determined due to frequent PVCs but visually appeared normal (EF 55-60%).  The right ventricle was normal in size and systolic function.  T1 imaging showed no definite evidence for fibrofatty infiltration of the RV wall.  There were no RV wall motion abnormalities or aneurysmal segments.  The atria were normal in size.  There was no ASD.  The aortic valve was trileaflet. Though flow sequences were not done to quantify, there was no visual evidence for significant MR or AI.  Delayed enhancement imaging was difficult due to ectopy but showed no definite delayed enhancement.  IMPRESSION: 1.  Normal LV size and systolic function.  2. Normal RV size and systolic function wth no evidence for ARVC.  3. No myocardial delayed enhancement, so no definite evidence for prior MI or infiltrative disease.  Provider: Lady Deutscher   ______________________________________________________________________________________________      Risk Assessment/Calculations:    CHA2DS2-VASc Score = 4   This indicates a 4.8% annual risk of stroke. The patient's score is based upon: CHF History: 0 HTN History: 1 Diabetes History: 0 Stroke History: 2 Vascular Disease History: 0 Age Score: 0 Gender Score: 1        STOP-Bang Score:  4      Physical Exam:   VS:  BP 124/86   Pulse 69   Ht 5\' 6"  (1.676 m)   Wt 225 lb 6.4 oz (102.2 kg)   SpO2 95%   BMI 36.38 kg/m    Wt Readings from Last 3 Encounters:  12/31/23 225 lb 6.4 oz (102.2 kg)  09/27/23 215 lb (97.5 kg)  08/13/23 207 lb 9.6 oz (94.2 kg)    GEN: Well nourished, well developed in no acute distress NECK: No JVD; No carotid bruits CARDIAC: RRR, no murmurs, rubs, gallops RESPIRATORY:  Clear to auscultation without rales, wheezing or rhonchi  ABDOMEN: Soft, non-tender, non-distended EXTREMITIES:  No edema; No deformity   ASSESSMENT AND PLAN: .   Paroxysmal fibrillation/hypercoagulable state-she is maintaining sinus rhythm, she is well versed when she is out of rhythm.  Continue Xarelto 20 mg once daily--no indication for dose reduction creatinine clearance 126, hemoglobin and hematocrit without evidence of anemia.  She has flecainide pill in the pocket as needed--has not needed.  Hypertension-blood pressure is controlled today at 124/86, she is currently on Norvasc 5 mg twice daily, as well as triamterene-HCTZ 37.5-25 mg daily.  She is given track of her blood pressure appears to be well-controlled at home.  She would like to get off some of her antihypertensive agents and we discussed lifestyle modifications including weight loss which might help bring her blood pressure into a better range allowing her to get off some of her antihypertensive agents.  PFO-s/p closure in 2020 >> 01/28/2020 echo bubble study negative bubble study s/p PFO closure.  Obesity -  BMI 36, she previously tried [place] and lost 20 pounds however she could not tolerate the  side effects.  Her insurance would not cover any other medications.  We did discuss referral to healthy weight and wellness but it seems she is aware of the tools for weight loss that is just been very difficult following menopause.  Did encourage her to try and lose 10 to 15 pounds Heart healthy diet and regular cardiovascular exercise encouraged.         Dispo:  Follow up in 6 months with Dr. Dulce Sellar.   Signed, Flossie Dibble, NP

## 2024-01-09 ENCOUNTER — Encounter: Payer: Self-pay | Admitting: Family Medicine

## 2024-01-10 ENCOUNTER — Encounter: Payer: Self-pay | Admitting: Family Medicine

## 2024-02-14 ENCOUNTER — Telehealth: Payer: Self-pay | Admitting: Internal Medicine

## 2024-02-14 NOTE — Telephone Encounter (Signed)
 Pt is requesting a provider switch from Dr. Avanell Bob to Dr. Ronell Coe. Please confirm.

## 2024-03-02 ENCOUNTER — Ambulatory Visit: Payer: 59 | Admitting: Internal Medicine

## 2024-05-04 ENCOUNTER — Other Ambulatory Visit: Payer: Self-pay | Admitting: *Deleted

## 2024-05-04 DIAGNOSIS — I4891 Unspecified atrial fibrillation: Secondary | ICD-10-CM

## 2024-05-04 MED ORDER — RIVAROXABAN 20 MG PO TABS
20.0000 mg | ORAL_TABLET | Freq: Every day | ORAL | 1 refills | Status: DC
Start: 2024-05-04 — End: 2024-07-10

## 2024-05-04 NOTE — Telephone Encounter (Signed)
 Xarelto  20mg  refill request received. Pt is 53 years old, weight-102.2kg, Crea-0.82 on 11/27/23, last seen by Delon Hoover on 12/31/23 & switching to Dr. Liborio from Dr Okey per 02/14/24 note in chart, Diagnosis-Afib, CrCl-126.54 mL/min; Dose is appropriate based on dosing criteria. Will send in refill to requested pharmacy.

## 2024-05-28 ENCOUNTER — Telehealth: Payer: Self-pay

## 2024-05-28 NOTE — Telephone Encounter (Signed)
   Pre-operative Risk Assessment    Patient Name: Savannah Dennis  DOB: 05-09-1971 MRN: [id number]   Date of last office visit: 12/31/23 Date of next office visit: 07/10/24   Request for Surgical Clearance    Procedure: Colonoscopy with Propofol  Date of Surgery:  Clearance 06/26/24                                Surgeon:  Dr. Jerrell Sol  Surgeon's Group or Practice Name:  [facility] Phone number:  [phone] Fax number:  [phone]   Type of Clearance Requested:   - Pharmacy:  Hold Rivaroxaban  (Xarelto ) 2 days (9/11 and 9/12)   Type of Anesthesia:  Not Indicated   Additional requests/questions:    Bonney Calvert Pouch   05/28/2024, 2:36 PM

## 2024-06-05 NOTE — Telephone Encounter (Signed)
 Patient with diagnosis of afib on Xarelto  for anticoagulation.    Procedure: Colonoscopy with Propofol  Date of procedure: 06/26/24  History of cryptogenic TIA 2019. Evaluated by Dr. Rosemarie in 2019 >> transcranial doppler revealed large R>L shunt with rest and valsalva. She underwent PFO closure on 12/04/2018. Evaluated in the ED in 2023 for atrial fibrillation.    CHA2DS2-VASc Score = 4   This indicates a 4.8% annual risk of stroke. The patient's score is based upon: CHF History: 0 HTN History: 1 Diabetes History: 0 Stroke History: 2 Vascular Disease History: 0 Age Score: 0 Gender Score: 1      CrCl 94 ml/min Plt: 262  Patient has not had an Afib/aflutter ablation within the last 3 months or DCCV within the last 30 days  Per office protocol, patient can hold Xarelto  for 2 days prior to procedure.    **This guidance is not considered finalized until pre-operative APP has relayed final recommendations.**

## 2024-06-05 NOTE — Telephone Encounter (Signed)
   Patient Name: Tityana Pagan  DOB: July 29, 1971 MRN: [id number]  Primary Cardiologist: Vina Gull, MD  Clinical pharmacists have reviewed the patient's past medical history, labs, and current medications as part of preoperative protocol coverage. The following recommendations have been made:   Patient with diagnosis of afib on Xarelto  for anticoagulation.     Procedure: Colonoscopy with Propofol  Date of procedure: 06/26/24   History of cryptogenic TIA 2019. Evaluated by Dr. Rosemarie in 2019 >> transcranial doppler revealed large R>L shunt with rest and valsalva. She underwent PFO closure on 12/04/2018. Evaluated in the ED in 2023 for atrial fibrillation.      CHA2DS2-VASc Score = 4   This indicates a 4.8% annual risk of stroke. The patient's score is based upon: CHF History: 0 HTN History: 1 Diabetes History: 0 Stroke History: 2 Vascular Disease History: 0 Age Score: 0 Gender Score: 1     CrCl 94 ml/min Plt: 262   Patient has not had an Afib/aflutter ablation within the last 3 months or DCCV within the last 30 days   Per office protocol, patient can hold Xarelto  for 2 days prior to procedure.     I will route this recommendation to the requesting party via Epic fax function and remove from pre-op pool.  Please call with questions.  Lamarr Satterfield, NP 06/05/2024, 12:00 PM

## 2024-06-11 ENCOUNTER — Ambulatory Visit: Admitting: Gastroenterology

## 2024-07-10 ENCOUNTER — Ambulatory Visit

## 2024-07-10 VITALS — BP 124/86 | HR 67 | Ht 66.0 in | Wt 210.4 lb

## 2024-07-10 DIAGNOSIS — I10 Essential (primary) hypertension: Secondary | ICD-10-CM

## 2024-07-10 DIAGNOSIS — Q2112 Patent foramen ovale: Secondary | ICD-10-CM

## 2024-07-10 DIAGNOSIS — I4891 Unspecified atrial fibrillation: Secondary | ICD-10-CM | POA: Diagnosis not present

## 2024-07-10 DIAGNOSIS — I480 Paroxysmal atrial fibrillation: Secondary | ICD-10-CM | POA: Diagnosis not present

## 2024-07-10 DIAGNOSIS — R0789 Other chest pain: Secondary | ICD-10-CM | POA: Diagnosis not present

## 2024-07-10 HISTORY — DX: Essential (primary) hypertension: I10

## 2024-07-10 MED ORDER — AMLODIPINE BESYLATE 5 MG PO TABS: 5.0000 mg | ORAL_TABLET | Freq: Every day | ORAL | 3 refills | Status: DC

## 2024-07-10 MED ORDER — RIVAROXABAN 20 MG PO TABS: 20.0000 mg | ORAL_TABLET | Freq: Every day | ORAL | 3 refills | Status: DC

## 2024-07-10 NOTE — Assessment & Plan Note (Signed)
 Paroxysmal atrial fibrillation, diagnosed at my Apple smart watch tracings. Last prominent episode was in October 2023. SABRA No further episodes since.  Has flecainide  150 mg tablet to use on an as-needed basis for 'pill in pocket' strategy.  CHA2DS2-VASc score 4. Continue with Xarelto  20 mg once daily. Refill sent.

## 2024-07-10 NOTE — Patient Instructions (Addendum)
 Medication Instructions:  Your physician recommends that you continue on your current medications as directed. Please refer to the Current Medication list given to you today.  *If you need a refill on your cardiac medications before your next appointment, please call your pharmacy*  Lab Work: NONE If you have labs (blood work) drawn today and your tests are completely normal, you will receive your results only by: MyChart Message (if you have MyChart) OR A paper copy in the mail If you have any lab test that is abnormal or we need to change your treatment, we will call you to review the results.  Testing/Procedures: NONE  Follow-Up: At [facility], you and your health needs are our priority.  As part of our continuing mission to provide you with exceptional heart care, our providers are all part of one team.  This team includes your primary Cardiologist (physician) and Advanced Practice Providers or APPs (Physician Assistants and Nurse Practitioners) who all work together to provide you with the care you need, when you need it.  Your next appointment:   6 month(s)  Provider:   Bertha Broad, MD   We recommend signing up for the patient portal called "MyChart".  Sign up information is provided on this After Visit Summary.  MyChart is used to connect with patients for Virtual Visits (Telemedicine).  Patients are able to view lab/test results, encounter notes, upcoming appointments, etc.  Non-urgent messages can be sent to your provider as well.   To learn more about what you can do with MyChart, go to [email or web address].   Other Instructions

## 2024-07-10 NOTE — Assessment & Plan Note (Signed)
 Atypical chest pain 3 episodes in the past 5 months, short lasting for seconds. Do not appear anginal in nature. No further CAD workup indicated at this time.

## 2024-07-10 NOTE — Assessment & Plan Note (Signed)
 S/p 18 mm Amplatzer device closure 12-04-2018. Well-seated and no shunt on last echocardiogram from November 2023. She feels her migraines have subsided since having the PFO closure done.

## 2024-07-10 NOTE — Assessment & Plan Note (Signed)
 Well-controlled on current regimen with amlodipine  and hydrochlorothiazide-triamterene .  Continue amlodipine , transition dose to 5 mg once daily [currently taking 2.5 mg twice daily]. Continue hydrochlorothiazide-triamterene  half a tablet of 25 mg - 37.5 mg once a day. Target blood pressure below 130/80 mmHg.
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
## 2024-07-15 ENCOUNTER — Other Ambulatory Visit: Payer: Self-pay

## 2024-07-15 DIAGNOSIS — I4891 Unspecified atrial fibrillation: Secondary | ICD-10-CM

## 2024-07-15 MED ORDER — RIVAROXABAN 20 MG PO TABS
20.0000 mg | ORAL_TABLET | Freq: Every day | ORAL | 0 refills | Status: DC
Start: 2024-07-15 — End: 2024-08-24

## 2024-07-21 ENCOUNTER — Telehealth: Payer: Self-pay

## 2024-07-21 NOTE — Telephone Encounter (Signed)
 Pt c/o medication issue:  1. Name of Medication:   flecainide  (TAMBOCOR ) 150 MG tablet    2. How are you currently taking this medication (dosage and times per day)? As written  3. Are you having a reaction (difficulty breathing--STAT)? no  4. What is your medication issue? Pt would like to know how often she can take the above medication   Pt would like a c/b regarding my chart message from 07/17/24

## 2024-07-23 DIAGNOSIS — R232 Flushing: Secondary | ICD-10-CM | POA: Insufficient documentation

## 2024-07-24 ENCOUNTER — Other Ambulatory Visit (HOSPITAL_COMMUNITY): Payer: Self-pay

## 2024-07-24 ENCOUNTER — Other Ambulatory Visit: Payer: Self-pay

## 2024-07-24 MED ORDER — FLECAINIDE ACETATE 50 MG PO TABS: 50.0000 mg | ORAL_TABLET | Freq: Two times a day (BID) | ORAL | 3 refills | Status: DC

## 2024-07-24 MED ORDER — METOPROLOL TARTRATE 25 MG PO TABS
12.5000 mg | ORAL_TABLET | Freq: Two times a day (BID) | ORAL | 3 refills | Status: DC
  Filled 2024-07-24: qty 180, 180d supply, fill #0

## 2024-07-24 MED ORDER — METOPROLOL TARTRATE 25 MG PO TABS: 12.5000 mg | ORAL_TABLET | Freq: Two times a day (BID) | ORAL | 3 refills | Status: DC

## 2024-07-24 MED ORDER — FLECAINIDE ACETATE 50 MG PO TABS
50.0000 mg | ORAL_TABLET | Freq: Two times a day (BID) | ORAL | 3 refills | Status: DC
  Filled 2024-07-24: qty 180, 90d supply, fill #0

## 2024-07-27 ENCOUNTER — Other Ambulatory Visit: Payer: Self-pay

## 2024-07-27 ENCOUNTER — Telehealth: Payer: Self-pay

## 2024-07-27 DIAGNOSIS — R0789 Other chest pain: Secondary | ICD-10-CM

## 2024-07-27 NOTE — Telephone Encounter (Signed)
 EKG Stress test ordered per Dr. Madireddy's note for 7-10 days after starting medications.

## 2024-07-27 NOTE — Telephone Encounter (Signed)
 EKG stress test per [facility]

## 2024-07-27 NOTE — Addendum Note (Signed)
 Addended by: ARLOA PLANAS D on: 07/27/2024 07:55 AM   Modules accepted: Orders

## 2024-07-28 ENCOUNTER — Ambulatory Visit

## 2024-07-29 ENCOUNTER — Ambulatory Visit: Admitting: Podiatry

## 2024-07-29 ENCOUNTER — Ambulatory Visit

## 2024-07-29 DIAGNOSIS — M7751 Other enthesopathy of right foot: Secondary | ICD-10-CM | POA: Diagnosis not present

## 2024-07-29 DIAGNOSIS — M722 Plantar fascial fibromatosis: Secondary | ICD-10-CM

## 2024-07-29 DIAGNOSIS — M7661 Achilles tendinitis, right leg: Secondary | ICD-10-CM | POA: Diagnosis not present

## 2024-07-29 DIAGNOSIS — M9261 Juvenile osteochondrosis of tarsus, right ankle: Secondary | ICD-10-CM | POA: Diagnosis not present

## 2024-07-29 DIAGNOSIS — M7731 Calcaneal spur, right foot: Secondary | ICD-10-CM | POA: Diagnosis not present

## 2024-07-29 NOTE — Patient Instructions (Signed)
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
## 2024-08-04 ENCOUNTER — Encounter: Payer: Self-pay | Admitting: *Deleted

## 2024-08-12 ENCOUNTER — Ambulatory Visit: Attending: Cardiology

## 2024-08-12 DIAGNOSIS — R0789 Other chest pain: Secondary | ICD-10-CM

## 2024-08-12 LAB — EXERCISE TOLERANCE TEST
Angina Index: 0
Duke Treadmill Score: 7
Estimated workload: 9.9
Exercise duration (min): 6 min
Exercise duration (sec): 59 s
MPHR: 165 {beats}/min
Peak HR: 114 {beats}/min
Percent HR: 69 %
RPE: 17
Rest HR: 56 {beats}/min
ST Depression (mm): 0 mm

## 2024-08-24 ENCOUNTER — Ambulatory Visit

## 2024-08-24 VITALS — BP 132/88 | HR 71 | Ht 66.0 in | Wt 213.6 lb

## 2024-08-24 DIAGNOSIS — E669 Obesity, unspecified: Secondary | ICD-10-CM

## 2024-08-24 DIAGNOSIS — I480 Paroxysmal atrial fibrillation: Secondary | ICD-10-CM

## 2024-08-24 DIAGNOSIS — I10 Essential (primary) hypertension: Secondary | ICD-10-CM | POA: Diagnosis not present

## 2024-08-24 MED ORDER — RIVAROXABAN 20 MG PO TABS
20.0000 mg | ORAL_TABLET | Freq: Every day | ORAL | 0 refills | Status: AC
Start: 2024-08-24 — End: ?

## 2024-08-24 MED ORDER — METOPROLOL TARTRATE 25 MG PO TABS: 12.5000 mg | ORAL_TABLET | Freq: Two times a day (BID) | ORAL | 3 refills | Status: AC

## 2024-08-24 MED ORDER — AMLODIPINE BESYLATE 5 MG PO TABS: 5.0000 mg | ORAL_TABLET | Freq: Every day | ORAL | 3 refills | Status: AC

## 2024-08-24 MED ORDER — FLECAINIDE ACETATE 50 MG PO TABS: 50.0000 mg | ORAL_TABLET | Freq: Two times a day (BID) | ORAL | 3 refills | Status: DC

## 2024-08-24 MED ORDER — RIVAROXABAN 20 MG PO TABS: 20.0000 mg | ORAL_TABLET | Freq: Every day | ORAL | 0 refills | Status: DC

## 2024-08-24 MED ORDER — TRIAMTERENE-HCTZ 37.5-25 MG PO TABS: 1.0000 | ORAL_TABLET | Freq: Every day | ORAL | 3 refills | Status: AC

## 2024-08-24 MED ORDER — AMLODIPINE BESYLATE 5 MG PO TABS: 5.0000 mg | ORAL_TABLET | Freq: Every day | ORAL | 3 refills | Status: DC

## 2024-08-24 NOTE — Patient Instructions (Signed)
Medication Instructions:  Your physician recommends that you continue on your current medications as directed. Please refer to the Current Medication list given to you today.  *If you need a refill on your cardiac medications before your next appointment, please call your pharmacy*   Lab Work: None Ordered If you have labs (blood work) drawn today and your tests are completely normal, you will receive your results only by: [place] (if you have MyChart) OR A paper copy in the mail If you have any lab test that is abnormal or we need to change your treatment, we will call you to review the results.   Testing/Procedures: None Ordered   Follow-Up: At [facility], you and your health needs are our priority.  As part of our continuing mission to provide you with exceptional heart care, we have created designated Provider Care Teams.  These Care Teams include your primary Cardiologist (physician) and Advanced Practice Providers (APPs -  Physician Assistants and Nurse Practitioners) who all work together to provide you with the care you need, when you need it.  We recommend signing up for the patient portal called "MyChart".  Sign up information is provided on this After Visit Summary.  MyChart is used to connect with patients for Virtual Visits (Telemedicine).  Patients are able to view lab/test results, encounter notes, upcoming appointments, etc.  Non-urgent messages can be sent to your provider as well.   To learn more about what you can do with MyChart, go to [email or web address].    Your next appointment:   8 month(s)  The format for your next appointment:   In Person  Provider:   Jenne Campus, MD    Other Instructions NA

## 2024-08-24 NOTE — Assessment & Plan Note (Addendum)
 Initially diagnosed 2021 by [employer] smart watch tracings. Previously on flecainide  pill in pocket strategy. More recently with breakthrough episodes since October 2025, on scheduled flecainide  plus metoprolol. treadmill EKG stress test 08/12/2024 no abnormal QRS widening on therapy with flecainide .  Continue rhythm control strategy with flecainide  50 mg twice daily plus metoprolol tartrate 12.5 mg twice daily.  CHA2DS2-VASc score 4. Continue Xarelto  20 mg once daily.  Tolerating well.  If she has any further breakthrough episodes, will refer to electrophysiologist.

## 2024-08-24 NOTE — Assessment & Plan Note (Signed)
 Encouraged to target diet modifications and weight loss-increase activity with exercise 30 minutes 5 times a week.
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
## 2024-08-24 NOTE — Assessment & Plan Note (Signed)
 Suboptimal today. Target blood pressure below 130/80 mmHg. She continues to take amlodipine  5 mg once daily. Continue hydrochlorothiazide-triamterene  25 mg - 37.5 mg once daily.  Advised her to maintain a blood pressure log for the next 1 to 2 weeks at home and let us  know on MyChart.

## 2024-09-02 ENCOUNTER — Ambulatory Visit: Admitting: Podiatry

## 2024-09-02 DIAGNOSIS — M7661 Achilles tendinitis, right leg: Secondary | ICD-10-CM

## 2024-09-02 DIAGNOSIS — M9261 Juvenile osteochondrosis of tarsus, right ankle: Secondary | ICD-10-CM | POA: Diagnosis not present
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
## 2024-10-23 ENCOUNTER — Other Ambulatory Visit: Payer: Self-pay
